# Patient Record
Sex: Female | Born: 1960 | Race: White | Hispanic: No | Smoking: Never smoker
Health system: Southern US, Community
[De-identification: ages and names within clinical notes are randomized; demographics above are authoritative.]

## PROBLEM LIST (undated history)

## (undated) DIAGNOSIS — G43909 Migraine, unspecified, not intractable, without status migrainosus: Secondary | ICD-10-CM

## (undated) DIAGNOSIS — T8859XA Other complications of anesthesia, initial encounter: Secondary | ICD-10-CM

## (undated) DIAGNOSIS — L309 Dermatitis, unspecified: Secondary | ICD-10-CM

## (undated) DIAGNOSIS — B009 Herpesviral infection, unspecified: Secondary | ICD-10-CM

## (undated) DIAGNOSIS — J302 Other seasonal allergic rhinitis: Secondary | ICD-10-CM

## (undated) DIAGNOSIS — D649 Anemia, unspecified: Secondary | ICD-10-CM

## (undated) HISTORY — DX: Migraine, unspecified, not intractable, without status migrainosus: G43.909

## (undated) HISTORY — PX: TONSILLECTOMY: SUR1361

## (undated) HISTORY — DX: Other seasonal allergic rhinitis: J30.2

## (undated) HISTORY — DX: Dermatitis, unspecified: L30.9

---

## 1999-05-20 ENCOUNTER — Encounter: Admission: RE | Admit: 1999-05-20 | Discharge: 1999-05-20 | Payer: Self-pay | Admitting: Family Medicine

## 1999-05-20 ENCOUNTER — Encounter: Payer: Self-pay | Admitting: Family Medicine

## 1999-11-12 ENCOUNTER — Encounter: Admission: RE | Admit: 1999-11-12 | Discharge: 1999-11-12 | Payer: Self-pay | Admitting: Gynecology

## 1999-11-12 ENCOUNTER — Encounter: Payer: Self-pay | Admitting: Gynecology

## 2000-05-23 ENCOUNTER — Other Ambulatory Visit: Admission: RE | Admit: 2000-05-23 | Discharge: 2000-05-23 | Payer: Self-pay | Admitting: Gynecology

## 2001-02-23 ENCOUNTER — Encounter: Admission: RE | Admit: 2001-02-23 | Discharge: 2001-02-23 | Payer: Self-pay | Admitting: Gynecology

## 2001-02-23 ENCOUNTER — Encounter: Payer: Self-pay | Admitting: Gynecology

## 2001-10-02 ENCOUNTER — Other Ambulatory Visit: Admission: RE | Admit: 2001-10-02 | Discharge: 2001-10-02 | Payer: Self-pay | Admitting: Gynecology

## 2003-04-14 ENCOUNTER — Other Ambulatory Visit: Admission: RE | Admit: 2003-04-14 | Discharge: 2003-04-14 | Payer: Self-pay | Admitting: Gynecology

## 2003-09-25 ENCOUNTER — Other Ambulatory Visit: Admission: RE | Admit: 2003-09-25 | Discharge: 2003-09-25 | Payer: Self-pay | Admitting: Gynecology

## 2004-06-17 ENCOUNTER — Other Ambulatory Visit: Admission: RE | Admit: 2004-06-17 | Discharge: 2004-06-17 | Payer: Self-pay | Admitting: Gynecology

## 2004-07-09 ENCOUNTER — Encounter: Admission: RE | Admit: 2004-07-09 | Discharge: 2004-07-09 | Payer: Self-pay | Admitting: Gynecology

## 2005-09-14 ENCOUNTER — Other Ambulatory Visit: Admission: RE | Admit: 2005-09-14 | Discharge: 2005-09-14 | Payer: Self-pay | Admitting: Gynecology

## 2005-09-21 ENCOUNTER — Encounter: Admission: RE | Admit: 2005-09-21 | Discharge: 2005-09-21 | Payer: Self-pay | Admitting: Gynecology

## 2006-09-20 ENCOUNTER — Other Ambulatory Visit: Admission: RE | Admit: 2006-09-20 | Discharge: 2006-09-20 | Payer: Self-pay | Admitting: Gynecology

## 2007-09-24 ENCOUNTER — Encounter: Admission: RE | Admit: 2007-09-24 | Discharge: 2007-09-24 | Payer: Self-pay | Admitting: Gynecology

## 2009-07-15 ENCOUNTER — Encounter: Admission: RE | Admit: 2009-07-15 | Discharge: 2009-07-15 | Payer: Self-pay | Admitting: Diagnostic Radiology

## 2009-07-28 ENCOUNTER — Encounter: Admission: RE | Admit: 2009-07-28 | Discharge: 2009-07-28 | Payer: Self-pay | Admitting: Family Medicine

## 2010-06-05 ENCOUNTER — Encounter: Payer: Self-pay | Admitting: Gynecology

## 2010-06-06 ENCOUNTER — Encounter: Payer: Self-pay | Admitting: Family Medicine

## 2010-08-12 ENCOUNTER — Emergency Department (HOSPITAL_COMMUNITY): Payer: BC Managed Care – PPO

## 2010-08-12 ENCOUNTER — Encounter (HOSPITAL_COMMUNITY): Payer: Self-pay | Admitting: Radiology

## 2010-08-12 ENCOUNTER — Emergency Department (HOSPITAL_COMMUNITY)
Admission: EM | Admit: 2010-08-12 | Discharge: 2010-08-12 | Disposition: A | Discharge status: Home / Self Care | Payer: BC Managed Care – PPO | Attending: Emergency Medicine | Admitting: Emergency Medicine

## 2010-08-12 DIAGNOSIS — R071 Chest pain on breathing: Secondary | ICD-10-CM | POA: Insufficient documentation

## 2010-08-12 DIAGNOSIS — Z79899 Other long term (current) drug therapy: Secondary | ICD-10-CM | POA: Insufficient documentation

## 2010-08-12 DIAGNOSIS — Z7982 Long term (current) use of aspirin: Secondary | ICD-10-CM | POA: Insufficient documentation

## 2010-08-12 HISTORY — DX: Herpesviral infection, unspecified: B00.9

## 2010-08-12 LAB — CBC
HCT: 33.6 % — ABNORMAL LOW (ref 36.0–46.0)
MCH: 25.8 pg — ABNORMAL LOW (ref 26.0–34.0)
MCV: 80.2 fL (ref 78.0–100.0)
Platelets: 225 10*3/uL (ref 150–400)
WBC: 9.8 10*3/uL (ref 4.0–10.5)

## 2010-08-12 LAB — POCT I-STAT, CHEM 8
Calcium, Ion: 1.15 mmol/L (ref 1.12–1.32)
HCT: 34 % — ABNORMAL LOW (ref 36.0–46.0)
Hemoglobin: 11.6 g/dL — ABNORMAL LOW (ref 12.0–15.0)
Sodium: 139 mEq/L (ref 135–145)
TCO2: 26 mmol/L (ref 0–100)

## 2010-08-12 LAB — DIFFERENTIAL
Basophils Absolute: 0 10*3/uL (ref 0.0–0.1)
Eosinophils Relative: 1 % (ref 0–5)
Neutro Abs: 6.3 10*3/uL (ref 1.7–7.7)
Neutrophils Relative %: 64 % (ref 43–77)

## 2010-08-12 MED ORDER — IOHEXOL 350 MG/ML SOLN
100.0000 mL | Freq: Once | INTRAVENOUS | Status: AC | PRN
  Administered 2010-08-12: 100 mL via INTRAVENOUS

## 2010-08-13 ENCOUNTER — Other Ambulatory Visit (HOSPITAL_COMMUNITY): Payer: Self-pay | Admitting: Family Medicine

## 2010-08-13 DIAGNOSIS — S24151A Other incomplete lesion at T1 level of thoracic spinal cord, initial encounter: Secondary | ICD-10-CM

## 2010-08-23 ENCOUNTER — Encounter (HOSPITAL_COMMUNITY): Payer: BC Managed Care – PPO

## 2010-08-23 ENCOUNTER — Ambulatory Visit (HOSPITAL_COMMUNITY): Payer: BC Managed Care – PPO

## 2010-08-31 ENCOUNTER — Encounter (HOSPITAL_COMMUNITY)
Admission: RE | Admit: 2010-08-31 | Discharge: 2010-08-31 | Disposition: A | Discharge status: Home / Self Care | Payer: BC Managed Care – PPO | Source: Ambulatory Visit | Attending: Family Medicine | Admitting: Family Medicine

## 2010-08-31 ENCOUNTER — Ambulatory Visit (HOSPITAL_COMMUNITY): Payer: BC Managed Care – PPO

## 2010-08-31 DIAGNOSIS — M899 Disorder of bone, unspecified: Secondary | ICD-10-CM | POA: Insufficient documentation

## 2010-08-31 DIAGNOSIS — S24151A Other incomplete lesion at T1 level of thoracic spinal cord, initial encounter: Secondary | ICD-10-CM

## 2010-08-31 DIAGNOSIS — M949 Disorder of cartilage, unspecified: Secondary | ICD-10-CM | POA: Insufficient documentation

## 2010-08-31 MED ORDER — TECHNETIUM TC 99M MEDRONATE IV KIT: 24.0000 | PACK | Freq: Once | INTRAVENOUS | Status: AC | PRN

## 2010-10-01 ENCOUNTER — Ambulatory Visit
Admission: RE | Admit: 2010-10-01 | Discharge: 2010-10-01 | Disposition: A | Discharge status: Home / Self Care | Payer: BC Managed Care – PPO | Source: Ambulatory Visit | Attending: Family Medicine | Admitting: Family Medicine

## 2010-10-01 ENCOUNTER — Other Ambulatory Visit: Payer: Self-pay | Admitting: Family Medicine

## 2010-10-01 DIAGNOSIS — Z1231 Encounter for screening mammogram for malignant neoplasm of breast: Secondary | ICD-10-CM

## 2012-04-04 ENCOUNTER — Other Ambulatory Visit: Payer: Self-pay | Admitting: Family Medicine

## 2012-04-04 DIAGNOSIS — Z1231 Encounter for screening mammogram for malignant neoplasm of breast: Secondary | ICD-10-CM

## 2012-04-05 ENCOUNTER — Ambulatory Visit
Admission: RE | Admit: 2012-04-05 | Discharge: 2012-04-05 | Disposition: A | Discharge status: Home / Self Care | Payer: BC Managed Care – PPO | Source: Ambulatory Visit | Attending: Family Medicine | Admitting: Family Medicine

## 2012-04-05 DIAGNOSIS — Z1231 Encounter for screening mammogram for malignant neoplasm of breast: Secondary | ICD-10-CM

## 2013-11-06 ENCOUNTER — Other Ambulatory Visit: Payer: Self-pay | Admitting: Family Medicine

## 2013-11-06 ENCOUNTER — Ambulatory Visit
Admission: RE | Admit: 2013-11-06 | Discharge: 2013-11-06 | Disposition: A | Discharge status: Home / Self Care | Payer: BC Managed Care – PPO | Source: Ambulatory Visit | Attending: Family Medicine | Admitting: Family Medicine

## 2013-11-06 DIAGNOSIS — M25551 Pain in right hip: Secondary | ICD-10-CM

## 2013-11-06 DIAGNOSIS — M546 Pain in thoracic spine: Secondary | ICD-10-CM

## 2015-01-12 ENCOUNTER — Other Ambulatory Visit: Payer: Self-pay | Admitting: Family Medicine

## 2015-01-12 ENCOUNTER — Ambulatory Visit
Admission: RE | Admit: 2015-01-12 | Discharge: 2015-01-12 | Disposition: A | Discharge status: Home / Self Care | Payer: BLUE CROSS/BLUE SHIELD | Source: Ambulatory Visit | Attending: Family Medicine | Admitting: Family Medicine

## 2015-01-12 DIAGNOSIS — M79605 Pain in left leg: Secondary | ICD-10-CM

## 2015-08-18 DIAGNOSIS — Z6828 Body mass index (BMI) 28.0-28.9, adult: Secondary | ICD-10-CM | POA: Diagnosis not present

## 2015-08-18 DIAGNOSIS — Z780 Asymptomatic menopausal state: Secondary | ICD-10-CM | POA: Diagnosis not present

## 2015-08-18 DIAGNOSIS — Z1389 Encounter for screening for other disorder: Secondary | ICD-10-CM | POA: Diagnosis not present

## 2015-08-18 DIAGNOSIS — Z01419 Encounter for gynecological examination (general) (routine) without abnormal findings: Secondary | ICD-10-CM | POA: Diagnosis not present

## 2015-08-18 DIAGNOSIS — N952 Postmenopausal atrophic vaginitis: Secondary | ICD-10-CM | POA: Diagnosis not present

## 2015-08-18 DIAGNOSIS — Z1231 Encounter for screening mammogram for malignant neoplasm of breast: Secondary | ICD-10-CM | POA: Diagnosis not present

## 2015-08-26 DIAGNOSIS — L821 Other seborrheic keratosis: Secondary | ICD-10-CM | POA: Diagnosis not present

## 2015-08-26 DIAGNOSIS — L738 Other specified follicular disorders: Secondary | ICD-10-CM | POA: Diagnosis not present

## 2015-08-26 DIAGNOSIS — L603 Nail dystrophy: Secondary | ICD-10-CM | POA: Diagnosis not present

## 2015-08-26 DIAGNOSIS — L820 Inflamed seborrheic keratosis: Secondary | ICD-10-CM | POA: Diagnosis not present

## 2016-01-28 DIAGNOSIS — T63441A Toxic effect of venom of bees, accidental (unintentional), initial encounter: Secondary | ICD-10-CM | POA: Diagnosis not present

## 2016-01-28 DIAGNOSIS — G500 Trigeminal neuralgia: Secondary | ICD-10-CM | POA: Diagnosis not present

## 2016-02-05 DIAGNOSIS — D2261 Melanocytic nevi of right upper limb, including shoulder: Secondary | ICD-10-CM | POA: Diagnosis not present

## 2016-02-05 DIAGNOSIS — L738 Other specified follicular disorders: Secondary | ICD-10-CM | POA: Diagnosis not present

## 2016-02-05 DIAGNOSIS — L821 Other seborrheic keratosis: Secondary | ICD-10-CM | POA: Diagnosis not present

## 2016-02-05 DIAGNOSIS — L720 Epidermal cyst: Secondary | ICD-10-CM | POA: Diagnosis not present

## 2016-02-12 DIAGNOSIS — H5203 Hypermetropia, bilateral: Secondary | ICD-10-CM | POA: Diagnosis not present

## 2016-02-12 DIAGNOSIS — H18832 Recurrent erosion of cornea, left eye: Secondary | ICD-10-CM | POA: Diagnosis not present

## 2016-03-29 DIAGNOSIS — Z0000 Encounter for general adult medical examination without abnormal findings: Secondary | ICD-10-CM | POA: Diagnosis not present

## 2016-03-29 DIAGNOSIS — Z1159 Encounter for screening for other viral diseases: Secondary | ICD-10-CM | POA: Diagnosis not present

## 2016-03-29 DIAGNOSIS — Z131 Encounter for screening for diabetes mellitus: Secondary | ICD-10-CM | POA: Diagnosis not present

## 2016-03-29 DIAGNOSIS — Z13220 Encounter for screening for lipoid disorders: Secondary | ICD-10-CM | POA: Diagnosis not present

## 2016-03-29 DIAGNOSIS — Z23 Encounter for immunization: Secondary | ICD-10-CM | POA: Diagnosis not present

## 2016-06-13 DIAGNOSIS — M8588 Other specified disorders of bone density and structure, other site: Secondary | ICD-10-CM | POA: Diagnosis not present

## 2016-07-04 DIAGNOSIS — H6121 Impacted cerumen, right ear: Secondary | ICD-10-CM | POA: Diagnosis not present

## 2016-07-04 DIAGNOSIS — F17210 Nicotine dependence, cigarettes, uncomplicated: Secondary | ICD-10-CM | POA: Diagnosis not present

## 2016-07-04 DIAGNOSIS — H93291 Other abnormal auditory perceptions, right ear: Secondary | ICD-10-CM | POA: Diagnosis not present

## 2016-08-24 DIAGNOSIS — N898 Other specified noninflammatory disorders of vagina: Secondary | ICD-10-CM | POA: Diagnosis not present

## 2016-08-24 DIAGNOSIS — N952 Postmenopausal atrophic vaginitis: Secondary | ICD-10-CM | POA: Diagnosis not present

## 2016-10-20 DIAGNOSIS — Z1389 Encounter for screening for other disorder: Secondary | ICD-10-CM | POA: Diagnosis not present

## 2016-10-20 DIAGNOSIS — Z1151 Encounter for screening for human papillomavirus (HPV): Secondary | ICD-10-CM | POA: Diagnosis not present

## 2016-10-20 DIAGNOSIS — N952 Postmenopausal atrophic vaginitis: Secondary | ICD-10-CM | POA: Diagnosis not present

## 2016-10-20 DIAGNOSIS — Z01419 Encounter for gynecological examination (general) (routine) without abnormal findings: Secondary | ICD-10-CM | POA: Diagnosis not present

## 2016-10-20 DIAGNOSIS — Z1231 Encounter for screening mammogram for malignant neoplasm of breast: Secondary | ICD-10-CM | POA: Diagnosis not present

## 2016-10-20 DIAGNOSIS — Z6829 Body mass index (BMI) 29.0-29.9, adult: Secondary | ICD-10-CM | POA: Diagnosis not present

## 2016-10-20 DIAGNOSIS — N898 Other specified noninflammatory disorders of vagina: Secondary | ICD-10-CM | POA: Diagnosis not present

## 2016-10-20 DIAGNOSIS — Z124 Encounter for screening for malignant neoplasm of cervix: Secondary | ICD-10-CM | POA: Diagnosis not present

## 2016-10-20 DIAGNOSIS — Z130 Encounter for screening for diseases of the blood and blood-forming organs and certain disorders involving the immune mechanism: Secondary | ICD-10-CM | POA: Diagnosis not present

## 2017-01-23 DIAGNOSIS — D2261 Melanocytic nevi of right upper limb, including shoulder: Secondary | ICD-10-CM | POA: Diagnosis not present

## 2017-01-23 DIAGNOSIS — L738 Other specified follicular disorders: Secondary | ICD-10-CM | POA: Diagnosis not present

## 2017-01-23 DIAGNOSIS — D225 Melanocytic nevi of trunk: Secondary | ICD-10-CM | POA: Diagnosis not present

## 2017-01-23 DIAGNOSIS — D2262 Melanocytic nevi of left upper limb, including shoulder: Secondary | ICD-10-CM | POA: Diagnosis not present

## 2017-01-26 DIAGNOSIS — N898 Other specified noninflammatory disorders of vagina: Secondary | ICD-10-CM | POA: Diagnosis not present

## 2017-01-26 DIAGNOSIS — N952 Postmenopausal atrophic vaginitis: Secondary | ICD-10-CM | POA: Diagnosis not present

## 2017-02-16 DIAGNOSIS — H5203 Hypermetropia, bilateral: Secondary | ICD-10-CM | POA: Diagnosis not present

## 2017-02-16 DIAGNOSIS — H18832 Recurrent erosion of cornea, left eye: Secondary | ICD-10-CM | POA: Diagnosis not present

## 2017-03-29 DIAGNOSIS — F324 Major depressive disorder, single episode, in partial remission: Secondary | ICD-10-CM | POA: Diagnosis not present

## 2017-03-29 DIAGNOSIS — E785 Hyperlipidemia, unspecified: Secondary | ICD-10-CM | POA: Diagnosis not present

## 2017-03-29 DIAGNOSIS — Z0000 Encounter for general adult medical examination without abnormal findings: Secondary | ICD-10-CM | POA: Diagnosis not present
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
## 2017-09-13 DIAGNOSIS — M9903 Segmental and somatic dysfunction of lumbar region: Secondary | ICD-10-CM | POA: Diagnosis not present

## 2017-09-13 DIAGNOSIS — Q72812 Congenital shortening of left lower limb: Secondary | ICD-10-CM | POA: Diagnosis not present

## 2017-09-13 DIAGNOSIS — M5442 Lumbago with sciatica, left side: Secondary | ICD-10-CM | POA: Diagnosis not present

## 2017-09-13 DIAGNOSIS — M9905 Segmental and somatic dysfunction of pelvic region: Secondary | ICD-10-CM | POA: Diagnosis not present

## 2017-09-14 DIAGNOSIS — M9903 Segmental and somatic dysfunction of lumbar region: Secondary | ICD-10-CM | POA: Diagnosis not present

## 2017-09-14 DIAGNOSIS — M5442 Lumbago with sciatica, left side: Secondary | ICD-10-CM | POA: Diagnosis not present

## 2017-09-14 DIAGNOSIS — M9905 Segmental and somatic dysfunction of pelvic region: Secondary | ICD-10-CM | POA: Diagnosis not present

## 2017-09-14 DIAGNOSIS — Q72812 Congenital shortening of left lower limb: Secondary | ICD-10-CM | POA: Diagnosis not present

## 2017-09-15 DIAGNOSIS — M5442 Lumbago with sciatica, left side: Secondary | ICD-10-CM | POA: Diagnosis not present

## 2017-09-15 DIAGNOSIS — Q72812 Congenital shortening of left lower limb: Secondary | ICD-10-CM | POA: Diagnosis not present

## 2017-09-15 DIAGNOSIS — M9903 Segmental and somatic dysfunction of lumbar region: Secondary | ICD-10-CM | POA: Diagnosis not present

## 2017-09-15 DIAGNOSIS — M9905 Segmental and somatic dysfunction of pelvic region: Secondary | ICD-10-CM | POA: Diagnosis not present

## 2017-09-18 DIAGNOSIS — M9903 Segmental and somatic dysfunction of lumbar region: Secondary | ICD-10-CM | POA: Diagnosis not present

## 2017-09-18 DIAGNOSIS — M5442 Lumbago with sciatica, left side: Secondary | ICD-10-CM | POA: Diagnosis not present

## 2017-09-18 DIAGNOSIS — M9905 Segmental and somatic dysfunction of pelvic region: Secondary | ICD-10-CM | POA: Diagnosis not present

## 2017-09-18 DIAGNOSIS — Q72812 Congenital shortening of left lower limb: Secondary | ICD-10-CM | POA: Diagnosis not present

## 2017-09-19 DIAGNOSIS — Q72812 Congenital shortening of left lower limb: Secondary | ICD-10-CM | POA: Diagnosis not present

## 2017-09-19 DIAGNOSIS — M5442 Lumbago with sciatica, left side: Secondary | ICD-10-CM | POA: Diagnosis not present

## 2017-09-19 DIAGNOSIS — M9903 Segmental and somatic dysfunction of lumbar region: Secondary | ICD-10-CM | POA: Diagnosis not present

## 2017-09-19 DIAGNOSIS — M9905 Segmental and somatic dysfunction of pelvic region: Secondary | ICD-10-CM | POA: Diagnosis not present

## 2017-09-21 DIAGNOSIS — M9905 Segmental and somatic dysfunction of pelvic region: Secondary | ICD-10-CM | POA: Diagnosis not present

## 2017-09-21 DIAGNOSIS — Q72812 Congenital shortening of left lower limb: Secondary | ICD-10-CM | POA: Diagnosis not present

## 2017-09-21 DIAGNOSIS — M9903 Segmental and somatic dysfunction of lumbar region: Secondary | ICD-10-CM | POA: Diagnosis not present

## 2017-09-21 DIAGNOSIS — M5442 Lumbago with sciatica, left side: Secondary | ICD-10-CM | POA: Diagnosis not present

## 2017-09-25 DIAGNOSIS — M9903 Segmental and somatic dysfunction of lumbar region: Secondary | ICD-10-CM | POA: Diagnosis not present

## 2017-09-25 DIAGNOSIS — M5442 Lumbago with sciatica, left side: Secondary | ICD-10-CM | POA: Diagnosis not present

## 2017-09-25 DIAGNOSIS — M9905 Segmental and somatic dysfunction of pelvic region: Secondary | ICD-10-CM | POA: Diagnosis not present

## 2017-09-25 DIAGNOSIS — Q72812 Congenital shortening of left lower limb: Secondary | ICD-10-CM | POA: Diagnosis not present

## 2017-09-26 DIAGNOSIS — Q72812 Congenital shortening of left lower limb: Secondary | ICD-10-CM | POA: Diagnosis not present

## 2017-09-26 DIAGNOSIS — M5442 Lumbago with sciatica, left side: Secondary | ICD-10-CM | POA: Diagnosis not present

## 2017-09-26 DIAGNOSIS — M9903 Segmental and somatic dysfunction of lumbar region: Secondary | ICD-10-CM | POA: Diagnosis not present

## 2017-09-26 DIAGNOSIS — M9905 Segmental and somatic dysfunction of pelvic region: Secondary | ICD-10-CM | POA: Diagnosis not present

## 2017-09-28 DIAGNOSIS — Q72812 Congenital shortening of left lower limb: Secondary | ICD-10-CM | POA: Diagnosis not present

## 2017-09-28 DIAGNOSIS — M5442 Lumbago with sciatica, left side: Secondary | ICD-10-CM | POA: Diagnosis not present

## 2017-09-28 DIAGNOSIS — M9905 Segmental and somatic dysfunction of pelvic region: Secondary | ICD-10-CM | POA: Diagnosis not present

## 2017-09-28 DIAGNOSIS — M9903 Segmental and somatic dysfunction of lumbar region: Secondary | ICD-10-CM | POA: Diagnosis not present

## 2017-10-02 DIAGNOSIS — M5442 Lumbago with sciatica, left side: Secondary | ICD-10-CM | POA: Diagnosis not present

## 2017-10-02 DIAGNOSIS — Q72812 Congenital shortening of left lower limb: Secondary | ICD-10-CM | POA: Diagnosis not present

## 2017-10-02 DIAGNOSIS — M9903 Segmental and somatic dysfunction of lumbar region: Secondary | ICD-10-CM | POA: Diagnosis not present

## 2017-10-02 DIAGNOSIS — M9905 Segmental and somatic dysfunction of pelvic region: Secondary | ICD-10-CM | POA: Diagnosis not present

## 2017-10-03 DIAGNOSIS — M5442 Lumbago with sciatica, left side: Secondary | ICD-10-CM | POA: Diagnosis not present

## 2017-10-03 DIAGNOSIS — M9905 Segmental and somatic dysfunction of pelvic region: Secondary | ICD-10-CM | POA: Diagnosis not present

## 2017-10-03 DIAGNOSIS — Q72812 Congenital shortening of left lower limb: Secondary | ICD-10-CM | POA: Diagnosis not present

## 2017-10-03 DIAGNOSIS — M9903 Segmental and somatic dysfunction of lumbar region: Secondary | ICD-10-CM | POA: Diagnosis not present

## 2017-10-04 DIAGNOSIS — Q72812 Congenital shortening of left lower limb: Secondary | ICD-10-CM | POA: Diagnosis not present

## 2017-10-04 DIAGNOSIS — M9905 Segmental and somatic dysfunction of pelvic region: Secondary | ICD-10-CM | POA: Diagnosis not present

## 2017-10-04 DIAGNOSIS — M9903 Segmental and somatic dysfunction of lumbar region: Secondary | ICD-10-CM | POA: Diagnosis not present

## 2017-10-04 DIAGNOSIS — M5442 Lumbago with sciatica, left side: Secondary | ICD-10-CM | POA: Diagnosis not present

## 2017-10-05 DIAGNOSIS — Q72812 Congenital shortening of left lower limb: Secondary | ICD-10-CM | POA: Diagnosis not present

## 2017-10-05 DIAGNOSIS — M9905 Segmental and somatic dysfunction of pelvic region: Secondary | ICD-10-CM | POA: Diagnosis not present

## 2017-10-05 DIAGNOSIS — M9903 Segmental and somatic dysfunction of lumbar region: Secondary | ICD-10-CM | POA: Diagnosis not present

## 2017-10-05 DIAGNOSIS — M5442 Lumbago with sciatica, left side: Secondary | ICD-10-CM | POA: Diagnosis not present

## 2017-10-10 DIAGNOSIS — M5442 Lumbago with sciatica, left side: Secondary | ICD-10-CM | POA: Diagnosis not present

## 2017-10-10 DIAGNOSIS — M9903 Segmental and somatic dysfunction of lumbar region: Secondary | ICD-10-CM | POA: Diagnosis not present

## 2017-10-10 DIAGNOSIS — Q72812 Congenital shortening of left lower limb: Secondary | ICD-10-CM | POA: Diagnosis not present

## 2017-10-10 DIAGNOSIS — M9905 Segmental and somatic dysfunction of pelvic region: Secondary | ICD-10-CM | POA: Diagnosis not present

## 2017-10-12 DIAGNOSIS — M9905 Segmental and somatic dysfunction of pelvic region: Secondary | ICD-10-CM | POA: Diagnosis not present

## 2017-10-12 DIAGNOSIS — M5442 Lumbago with sciatica, left side: Secondary | ICD-10-CM | POA: Diagnosis not present

## 2017-10-12 DIAGNOSIS — M9903 Segmental and somatic dysfunction of lumbar region: Secondary | ICD-10-CM | POA: Diagnosis not present

## 2017-10-12 DIAGNOSIS — Q72812 Congenital shortening of left lower limb: Secondary | ICD-10-CM | POA: Diagnosis not present

## 2017-10-16 DIAGNOSIS — M5442 Lumbago with sciatica, left side: Secondary | ICD-10-CM | POA: Diagnosis not present

## 2017-10-16 DIAGNOSIS — Q72812 Congenital shortening of left lower limb: Secondary | ICD-10-CM | POA: Diagnosis not present

## 2017-10-16 DIAGNOSIS — M9903 Segmental and somatic dysfunction of lumbar region: Secondary | ICD-10-CM | POA: Diagnosis not present

## 2017-10-16 DIAGNOSIS — M9905 Segmental and somatic dysfunction of pelvic region: Secondary | ICD-10-CM | POA: Diagnosis not present

## 2017-10-18 DIAGNOSIS — M5442 Lumbago with sciatica, left side: Secondary | ICD-10-CM | POA: Diagnosis not present

## 2017-10-18 DIAGNOSIS — Q72812 Congenital shortening of left lower limb: Secondary | ICD-10-CM | POA: Diagnosis not present

## 2017-10-18 DIAGNOSIS — M9903 Segmental and somatic dysfunction of lumbar region: Secondary | ICD-10-CM | POA: Diagnosis not present

## 2017-10-18 DIAGNOSIS — M9905 Segmental and somatic dysfunction of pelvic region: Secondary | ICD-10-CM | POA: Diagnosis not present

## 2017-10-23 DIAGNOSIS — Q72812 Congenital shortening of left lower limb: Secondary | ICD-10-CM | POA: Diagnosis not present

## 2017-10-23 DIAGNOSIS — M9903 Segmental and somatic dysfunction of lumbar region: Secondary | ICD-10-CM | POA: Diagnosis not present

## 2017-10-23 DIAGNOSIS — M9905 Segmental and somatic dysfunction of pelvic region: Secondary | ICD-10-CM | POA: Diagnosis not present

## 2017-10-23 DIAGNOSIS — M5442 Lumbago with sciatica, left side: Secondary | ICD-10-CM | POA: Diagnosis not present

## 2017-10-26 DIAGNOSIS — M5442 Lumbago with sciatica, left side: Secondary | ICD-10-CM | POA: Diagnosis not present

## 2017-10-26 DIAGNOSIS — Q72812 Congenital shortening of left lower limb: Secondary | ICD-10-CM | POA: Diagnosis not present

## 2017-10-26 DIAGNOSIS — M9903 Segmental and somatic dysfunction of lumbar region: Secondary | ICD-10-CM | POA: Diagnosis not present

## 2017-10-26 DIAGNOSIS — M9905 Segmental and somatic dysfunction of pelvic region: Secondary | ICD-10-CM | POA: Diagnosis not present

## 2017-10-30 DIAGNOSIS — M5442 Lumbago with sciatica, left side: Secondary | ICD-10-CM | POA: Diagnosis not present

## 2017-10-30 DIAGNOSIS — M9903 Segmental and somatic dysfunction of lumbar region: Secondary | ICD-10-CM | POA: Diagnosis not present

## 2017-10-30 DIAGNOSIS — R002 Palpitations: Secondary | ICD-10-CM | POA: Diagnosis not present

## 2017-10-30 DIAGNOSIS — B351 Tinea unguium: Secondary | ICD-10-CM | POA: Diagnosis not present

## 2017-10-30 DIAGNOSIS — M9905 Segmental and somatic dysfunction of pelvic region: Secondary | ICD-10-CM | POA: Diagnosis not present

## 2017-10-30 DIAGNOSIS — Q72812 Congenital shortening of left lower limb: Secondary | ICD-10-CM | POA: Diagnosis not present

## 2017-11-07 DIAGNOSIS — Z1231 Encounter for screening mammogram for malignant neoplasm of breast: Secondary | ICD-10-CM | POA: Diagnosis not present

## 2017-11-07 DIAGNOSIS — Z780 Asymptomatic menopausal state: Secondary | ICD-10-CM | POA: Diagnosis not present

## 2017-11-07 DIAGNOSIS — Z6828 Body mass index (BMI) 28.0-28.9, adult: Secondary | ICD-10-CM | POA: Diagnosis not present

## 2017-11-07 DIAGNOSIS — Z130 Encounter for screening for diseases of the blood and blood-forming organs and certain disorders involving the immune mechanism: Secondary | ICD-10-CM | POA: Diagnosis not present

## 2017-11-07 DIAGNOSIS — Z1389 Encounter for screening for other disorder: Secondary | ICD-10-CM | POA: Diagnosis not present

## 2017-11-07 DIAGNOSIS — Z01419 Encounter for gynecological examination (general) (routine) without abnormal findings: Secondary | ICD-10-CM | POA: Diagnosis not present

## 2018-02-22 DIAGNOSIS — H52203 Unspecified astigmatism, bilateral: Secondary | ICD-10-CM | POA: Diagnosis not present

## 2018-02-22 DIAGNOSIS — H524 Presbyopia: Secondary | ICD-10-CM | POA: Diagnosis not present

## 2018-02-22 DIAGNOSIS — H18832 Recurrent erosion of cornea, left eye: Secondary | ICD-10-CM | POA: Diagnosis not present

## 2018-02-26 DIAGNOSIS — D2262 Melanocytic nevi of left upper limb, including shoulder: Secondary | ICD-10-CM | POA: Diagnosis not present

## 2018-02-26 DIAGNOSIS — L738 Other specified follicular disorders: Secondary | ICD-10-CM | POA: Diagnosis not present

## 2018-02-26 DIAGNOSIS — L820 Inflamed seborrheic keratosis: Secondary | ICD-10-CM | POA: Diagnosis not present

## 2018-02-26 DIAGNOSIS — L821 Other seborrheic keratosis: Secondary | ICD-10-CM | POA: Diagnosis not present

## 2018-02-26 DIAGNOSIS — D1801 Hemangioma of skin and subcutaneous tissue: Secondary | ICD-10-CM | POA: Diagnosis not present

## 2018-04-10 DIAGNOSIS — Z0000 Encounter for general adult medical examination without abnormal findings: Secondary | ICD-10-CM | POA: Diagnosis not present

## 2018-04-10 DIAGNOSIS — F324 Major depressive disorder, single episode, in partial remission: Secondary | ICD-10-CM | POA: Diagnosis not present

## 2018-04-10 DIAGNOSIS — J309 Allergic rhinitis, unspecified: Secondary | ICD-10-CM | POA: Diagnosis not present

## 2018-04-10 DIAGNOSIS — E785 Hyperlipidemia, unspecified: Secondary | ICD-10-CM | POA: Diagnosis not present

## 2018-06-26 DIAGNOSIS — F4323 Adjustment disorder with mixed anxiety and depressed mood: Secondary | ICD-10-CM | POA: Diagnosis not present

## 2018-07-09 DIAGNOSIS — F4323 Adjustment disorder with mixed anxiety and depressed mood: Secondary | ICD-10-CM | POA: Diagnosis not present

## 2018-07-30 DIAGNOSIS — F4323 Adjustment disorder with mixed anxiety and depressed mood: Secondary | ICD-10-CM | POA: Diagnosis not present

## 2018-08-06 DIAGNOSIS — F4323 Adjustment disorder with mixed anxiety and depressed mood: Secondary | ICD-10-CM | POA: Diagnosis not present

## 2018-08-20 DIAGNOSIS — F4323 Adjustment disorder with mixed anxiety and depressed mood: Secondary | ICD-10-CM | POA: Diagnosis not present

## 2018-11-27 DIAGNOSIS — Z780 Asymptomatic menopausal state: Secondary | ICD-10-CM | POA: Diagnosis not present

## 2018-11-27 DIAGNOSIS — Z130 Encounter for screening for diseases of the blood and blood-forming organs and certain disorders involving the immune mechanism: Secondary | ICD-10-CM | POA: Diagnosis not present

## 2018-11-27 DIAGNOSIS — Z1231 Encounter for screening mammogram for malignant neoplasm of breast: Secondary | ICD-10-CM | POA: Diagnosis not present

## 2018-11-27 DIAGNOSIS — Z01419 Encounter for gynecological examination (general) (routine) without abnormal findings: Secondary | ICD-10-CM | POA: Diagnosis not present

## 2018-11-27 DIAGNOSIS — Z6827 Body mass index (BMI) 27.0-27.9, adult: Secondary | ICD-10-CM | POA: Diagnosis not present

## 2019-02-28 DIAGNOSIS — H2513 Age-related nuclear cataract, bilateral: Secondary | ICD-10-CM | POA: Diagnosis not present

## 2019-02-28 DIAGNOSIS — H18832 Recurrent erosion of cornea, left eye: Secondary | ICD-10-CM | POA: Diagnosis not present

## 2019-02-28 DIAGNOSIS — H5203 Hypermetropia, bilateral: Secondary | ICD-10-CM | POA: Diagnosis not present

## 2019-03-22 ENCOUNTER — Other Ambulatory Visit: Payer: Self-pay

## 2019-03-22 DIAGNOSIS — Z20822 Contact with and (suspected) exposure to covid-19: Secondary | ICD-10-CM

## 2019-03-23 LAB — NOVEL CORONAVIRUS, NAA: SARS-CoV-2, NAA: NOT DETECTED

## 2019-04-29 DIAGNOSIS — Z0000 Encounter for general adult medical examination without abnormal findings: Secondary | ICD-10-CM | POA: Diagnosis not present

## 2019-04-29 DIAGNOSIS — E785 Hyperlipidemia, unspecified: Secondary | ICD-10-CM | POA: Diagnosis not present

## 2019-06-05 DIAGNOSIS — L821 Other seborrheic keratosis: Secondary | ICD-10-CM | POA: Diagnosis not present

## 2019-06-05 DIAGNOSIS — D2271 Melanocytic nevi of right lower limb, including hip: Secondary | ICD-10-CM | POA: Diagnosis not present

## 2019-06-05 DIAGNOSIS — L738 Other specified follicular disorders: Secondary | ICD-10-CM | POA: Diagnosis not present

## 2019-06-05 DIAGNOSIS — D2272 Melanocytic nevi of left lower limb, including hip: Secondary | ICD-10-CM | POA: Diagnosis not present

## 2019-08-03 DIAGNOSIS — S76912A Strain of unspecified muscles, fascia and tendons at thigh level, left thigh, initial encounter: Secondary | ICD-10-CM | POA: Diagnosis not present

## 2019-08-03 DIAGNOSIS — M79605 Pain in left leg: Secondary | ICD-10-CM | POA: Diagnosis not present

## 2019-08-06 DIAGNOSIS — E7800 Pure hypercholesterolemia, unspecified: Secondary | ICD-10-CM | POA: Diagnosis not present

## 2019-12-04 DIAGNOSIS — N952 Postmenopausal atrophic vaginitis: Secondary | ICD-10-CM | POA: Diagnosis not present

## 2019-12-04 DIAGNOSIS — Z130 Encounter for screening for diseases of the blood and blood-forming organs and certain disorders involving the immune mechanism: Secondary | ICD-10-CM | POA: Diagnosis not present

## 2019-12-04 DIAGNOSIS — Z01419 Encounter for gynecological examination (general) (routine) without abnormal findings: Secondary | ICD-10-CM | POA: Diagnosis not present

## 2019-12-04 DIAGNOSIS — Z1151 Encounter for screening for human papillomavirus (HPV): Secondary | ICD-10-CM | POA: Diagnosis not present

## 2019-12-04 DIAGNOSIS — N9089 Other specified noninflammatory disorders of vulva and perineum: Secondary | ICD-10-CM | POA: Diagnosis not present

## 2019-12-04 DIAGNOSIS — Z780 Asymptomatic menopausal state: Secondary | ICD-10-CM | POA: Diagnosis not present

## 2019-12-04 DIAGNOSIS — Z124 Encounter for screening for malignant neoplasm of cervix: Secondary | ICD-10-CM | POA: Diagnosis not present

## 2019-12-26 DIAGNOSIS — Z1231 Encounter for screening mammogram for malignant neoplasm of breast: Secondary | ICD-10-CM | POA: Diagnosis not present

## 2020-03-05 DIAGNOSIS — H18832 Recurrent erosion of cornea, left eye: Secondary | ICD-10-CM | POA: Diagnosis not present

## 2020-03-05 DIAGNOSIS — H2513 Age-related nuclear cataract, bilateral: Secondary | ICD-10-CM | POA: Diagnosis not present

## 2020-03-05 DIAGNOSIS — H5203 Hypermetropia, bilateral: Secondary | ICD-10-CM | POA: Diagnosis not present

## 2020-04-30 DIAGNOSIS — J309 Allergic rhinitis, unspecified: Secondary | ICD-10-CM | POA: Diagnosis not present

## 2020-04-30 DIAGNOSIS — M79675 Pain in left toe(s): Secondary | ICD-10-CM | POA: Diagnosis not present

## 2020-04-30 DIAGNOSIS — E785 Hyperlipidemia, unspecified: Secondary | ICD-10-CM | POA: Diagnosis not present

## 2020-04-30 DIAGNOSIS — Z0000 Encounter for general adult medical examination without abnormal findings: Secondary | ICD-10-CM | POA: Diagnosis not present

## 2020-05-27 DIAGNOSIS — D2261 Melanocytic nevi of right upper limb, including shoulder: Secondary | ICD-10-CM | POA: Diagnosis not present

## 2020-05-27 DIAGNOSIS — L738 Other specified follicular disorders: Secondary | ICD-10-CM | POA: Diagnosis not present

## 2020-05-27 DIAGNOSIS — D2271 Melanocytic nevi of right lower limb, including hip: Secondary | ICD-10-CM | POA: Diagnosis not present

## 2020-05-27 DIAGNOSIS — D225 Melanocytic nevi of trunk: Secondary | ICD-10-CM | POA: Diagnosis not present

## 2020-07-16 DIAGNOSIS — Z01812 Encounter for preprocedural laboratory examination: Secondary | ICD-10-CM | POA: Diagnosis not present

## 2020-07-21 DIAGNOSIS — D122 Benign neoplasm of ascending colon: Secondary | ICD-10-CM | POA: Diagnosis not present

## 2020-07-21 DIAGNOSIS — Z1211 Encounter for screening for malignant neoplasm of colon: Secondary | ICD-10-CM | POA: Diagnosis not present

## 2020-08-13 DIAGNOSIS — H6121 Impacted cerumen, right ear: Secondary | ICD-10-CM | POA: Diagnosis not present

## 2020-12-29 DIAGNOSIS — Z1231 Encounter for screening mammogram for malignant neoplasm of breast: Secondary | ICD-10-CM | POA: Diagnosis not present

## 2020-12-29 DIAGNOSIS — Z130 Encounter for screening for diseases of the blood and blood-forming organs and certain disorders involving the immune mechanism: Secondary | ICD-10-CM | POA: Diagnosis not present

## 2020-12-29 DIAGNOSIS — Z6827 Body mass index (BMI) 27.0-27.9, adult: Secondary | ICD-10-CM | POA: Diagnosis not present

## 2020-12-29 DIAGNOSIS — Z780 Asymptomatic menopausal state: Secondary | ICD-10-CM | POA: Diagnosis not present

## 2020-12-29 DIAGNOSIS — Z01411 Encounter for gynecological examination (general) (routine) with abnormal findings: Secondary | ICD-10-CM | POA: Diagnosis not present

## 2020-12-29 DIAGNOSIS — N9089 Other specified noninflammatory disorders of vulva and perineum: Secondary | ICD-10-CM | POA: Diagnosis not present

## 2021-03-11 DIAGNOSIS — H18832 Recurrent erosion of cornea, left eye: Secondary | ICD-10-CM | POA: Diagnosis not present

## 2021-03-11 DIAGNOSIS — H2013 Chronic iridocyclitis, bilateral: Secondary | ICD-10-CM | POA: Diagnosis not present

## 2021-03-11 DIAGNOSIS — H5203 Hypermetropia, bilateral: Secondary | ICD-10-CM | POA: Diagnosis not present

## 2021-03-11 DIAGNOSIS — H25013 Cortical age-related cataract, bilateral: Secondary | ICD-10-CM | POA: Diagnosis not present

## 2021-05-25 DIAGNOSIS — Z0000 Encounter for general adult medical examination without abnormal findings: Secondary | ICD-10-CM | POA: Diagnosis not present

## 2021-05-25 DIAGNOSIS — E785 Hyperlipidemia, unspecified: Secondary | ICD-10-CM | POA: Diagnosis not present

## 2021-05-25 DIAGNOSIS — M79675 Pain in left toe(s): Secondary | ICD-10-CM | POA: Diagnosis not present

## 2021-05-25 DIAGNOSIS — J309 Allergic rhinitis, unspecified: Secondary | ICD-10-CM | POA: Diagnosis not present

## 2021-06-23 DIAGNOSIS — D225 Melanocytic nevi of trunk: Secondary | ICD-10-CM | POA: Diagnosis not present

## 2021-06-23 DIAGNOSIS — L738 Other specified follicular disorders: Secondary | ICD-10-CM | POA: Diagnosis not present

## 2021-06-23 DIAGNOSIS — D2261 Melanocytic nevi of right upper limb, including shoulder: Secondary | ICD-10-CM | POA: Diagnosis not present

## 2021-06-23 DIAGNOSIS — L820 Inflamed seborrheic keratosis: Secondary | ICD-10-CM | POA: Diagnosis not present

## 2021-06-23 DIAGNOSIS — L821 Other seborrheic keratosis: Secondary | ICD-10-CM | POA: Diagnosis not present

## 2021-06-23 DIAGNOSIS — D485 Neoplasm of uncertain behavior of skin: Secondary | ICD-10-CM | POA: Diagnosis not present

## 2022-01-07 DIAGNOSIS — F324 Major depressive disorder, single episode, in partial remission: Secondary | ICD-10-CM | POA: Diagnosis not present

## 2022-01-07 DIAGNOSIS — R413 Other amnesia: Secondary | ICD-10-CM | POA: Diagnosis not present

## 2022-02-09 ENCOUNTER — Other Ambulatory Visit: Payer: Self-pay | Admitting: Home Modifications

## 2022-02-09 DIAGNOSIS — E039 Hypothyroidism, unspecified: Secondary | ICD-10-CM | POA: Diagnosis not present

## 2022-02-09 DIAGNOSIS — E063 Autoimmune thyroiditis: Secondary | ICD-10-CM | POA: Diagnosis not present

## 2022-02-11 ENCOUNTER — Ambulatory Visit
Admission: RE | Admit: 2022-02-11 | Discharge: 2022-02-11 | Disposition: A | Discharge status: Home / Self Care | Payer: BC Managed Care – PPO | Source: Ambulatory Visit | Attending: Home Modifications | Admitting: Home Modifications

## 2022-02-11 DIAGNOSIS — E039 Hypothyroidism, unspecified: Secondary | ICD-10-CM | POA: Diagnosis not present

## 2022-02-11 DIAGNOSIS — E041 Nontoxic single thyroid nodule: Secondary | ICD-10-CM | POA: Diagnosis not present

## 2022-02-14 ENCOUNTER — Other Ambulatory Visit: Payer: Self-pay | Admitting: Home Modifications

## 2022-02-14 DIAGNOSIS — E041 Nontoxic single thyroid nodule: Secondary | ICD-10-CM

## 2022-02-15 DIAGNOSIS — F324 Major depressive disorder, single episode, in partial remission: Secondary | ICD-10-CM | POA: Diagnosis not present

## 2022-02-15 DIAGNOSIS — E063 Autoimmune thyroiditis: Secondary | ICD-10-CM | POA: Diagnosis not present

## 2022-02-15 DIAGNOSIS — R413 Other amnesia: Secondary | ICD-10-CM | POA: Diagnosis not present

## 2022-03-09 DIAGNOSIS — Z1231 Encounter for screening mammogram for malignant neoplasm of breast: Secondary | ICD-10-CM | POA: Diagnosis not present

## 2022-03-09 DIAGNOSIS — Z130 Encounter for screening for diseases of the blood and blood-forming organs and certain disorders involving the immune mechanism: Secondary | ICD-10-CM | POA: Diagnosis not present

## 2022-03-09 DIAGNOSIS — Z1389 Encounter for screening for other disorder: Secondary | ICD-10-CM | POA: Diagnosis not present

## 2022-03-09 DIAGNOSIS — Z01419 Encounter for gynecological examination (general) (routine) without abnormal findings: Secondary | ICD-10-CM | POA: Diagnosis not present

## 2022-03-09 DIAGNOSIS — Z780 Asymptomatic menopausal state: Secondary | ICD-10-CM | POA: Diagnosis not present

## 2022-03-14 DIAGNOSIS — H2513 Age-related nuclear cataract, bilateral: Secondary | ICD-10-CM | POA: Diagnosis not present

## 2022-03-14 DIAGNOSIS — H524 Presbyopia: Secondary | ICD-10-CM | POA: Diagnosis not present

## 2022-03-14 DIAGNOSIS — H5203 Hypermetropia, bilateral: Secondary | ICD-10-CM | POA: Diagnosis not present

## 2022-03-14 DIAGNOSIS — H25013 Cortical age-related cataract, bilateral: Secondary | ICD-10-CM | POA: Diagnosis not present

## 2022-03-23 DIAGNOSIS — E039 Hypothyroidism, unspecified: Secondary | ICD-10-CM | POA: Diagnosis not present

## 2022-03-30 ENCOUNTER — Ambulatory Visit
Admission: RE | Admit: 2022-03-30 | Discharge: 2022-03-30 | Disposition: A | Discharge status: Home / Self Care | Payer: BC Managed Care – PPO | Source: Ambulatory Visit | Attending: Home Modifications | Admitting: Home Modifications

## 2022-03-30 ENCOUNTER — Other Ambulatory Visit (HOSPITAL_COMMUNITY)
Admission: RE | Admit: 2022-03-30 | Discharge: 2022-03-30 | Disposition: A | Discharge status: Home / Self Care | Payer: BC Managed Care – PPO | Source: Ambulatory Visit | Attending: Physician Assistant | Admitting: Physician Assistant

## 2022-03-30 DIAGNOSIS — E041 Nontoxic single thyroid nodule: Secondary | ICD-10-CM | POA: Insufficient documentation

## 2022-04-04 LAB — CYTOLOGY - NON PAP

## 2022-05-13 DIAGNOSIS — E041 Nontoxic single thyroid nodule: Secondary | ICD-10-CM | POA: Diagnosis not present

## 2022-05-20 ENCOUNTER — Other Ambulatory Visit: Payer: Self-pay

## 2022-05-20 ENCOUNTER — Encounter (HOSPITAL_COMMUNITY): Payer: Self-pay | Admitting: General Surgery

## 2022-05-20 NOTE — Progress Notes (Signed)
Pt. Needs orders for surgery. 

## 2022-05-23 ENCOUNTER — Ambulatory Visit: Payer: Self-pay | Admitting: General Surgery

## 2022-05-24 ENCOUNTER — Ambulatory Visit (HOSPITAL_COMMUNITY): Payer: BC Managed Care – PPO | Admitting: Anesthesiology

## 2022-05-24 ENCOUNTER — Encounter (HOSPITAL_COMMUNITY): Payer: Self-pay | Admitting: General Surgery

## 2022-05-24 ENCOUNTER — Encounter (HOSPITAL_COMMUNITY)
Admission: RE | Disposition: A | Discharge status: Home / Self Care | Payer: Self-pay | Source: Home / Self Care | Attending: General Surgery

## 2022-05-24 ENCOUNTER — Observation Stay (HOSPITAL_COMMUNITY)
Admission: RE | Admit: 2022-05-24 | Discharge: 2022-05-25 | Disposition: A | Discharge status: Home / Self Care | Payer: BC Managed Care – PPO | Attending: General Surgery | Admitting: General Surgery

## 2022-05-24 DIAGNOSIS — E065 Other chronic thyroiditis: Secondary | ICD-10-CM | POA: Diagnosis not present

## 2022-05-24 DIAGNOSIS — C73 Malignant neoplasm of thyroid gland: Secondary | ICD-10-CM | POA: Diagnosis not present

## 2022-05-24 DIAGNOSIS — E039 Hypothyroidism, unspecified: Secondary | ICD-10-CM | POA: Insufficient documentation

## 2022-05-24 DIAGNOSIS — E041 Nontoxic single thyroid nodule: Secondary | ICD-10-CM | POA: Diagnosis not present

## 2022-05-24 DIAGNOSIS — Z01818 Encounter for other preprocedural examination: Secondary | ICD-10-CM

## 2022-05-24 DIAGNOSIS — E042 Nontoxic multinodular goiter: Secondary | ICD-10-CM | POA: Diagnosis not present

## 2022-05-24 DIAGNOSIS — Z79899 Other long term (current) drug therapy: Secondary | ICD-10-CM | POA: Insufficient documentation

## 2022-05-24 DIAGNOSIS — E079 Disorder of thyroid, unspecified: Secondary | ICD-10-CM | POA: Diagnosis present

## 2022-05-24 HISTORY — DX: Other complications of anesthesia, initial encounter: T88.59XA

## 2022-05-24 HISTORY — PX: THYROID LOBECTOMY: SHX420

## 2022-05-24 HISTORY — DX: Anemia, unspecified: D64.9

## 2022-05-24 LAB — CBC
HCT: 44.9 % (ref 36.0–46.0)
HCT: 41.3 % (ref 36.0–46.0)
Hemoglobin: 14.6 g/dL (ref 12.0–15.0)
Hemoglobin: 13.5 g/dL (ref 12.0–15.0)
MCH: 30 pg (ref 26.0–34.0)
MCH: 29.8 pg (ref 26.0–34.0)
MCHC: 32.5 g/dL (ref 30.0–36.0)
MCHC: 32.7 g/dL (ref 30.0–36.0)
MCV: 92.4 fL (ref 80.0–100.0)
MCV: 91.2 fL (ref 80.0–100.0)
Platelets: 206 10*3/uL (ref 150–400)
Platelets: 200 10*3/uL (ref 150–400)
RBC: 4.86 MIL/uL (ref 3.87–5.11)
RBC: 4.53 MIL/uL (ref 3.87–5.11)
RDW: 11.6 % (ref 11.5–15.5)
RDW: 11.5 % (ref 11.5–15.5)
WBC: 6.3 10*3/uL (ref 4.0–10.5)
WBC: 6.7 10*3/uL (ref 4.0–10.5)
nRBC: 0 % (ref 0.0–0.2)
nRBC: 0 % (ref 0.0–0.2)

## 2022-05-24 LAB — CREATININE, SERUM
Creatinine, Ser: 0.86 mg/dL (ref 0.44–1.00)
GFR, Estimated: >60 mL/min (ref >60)

## 2022-05-24 SURGERY — LOBECTOMY, THYROID
Anesthesia: General | Laterality: Left

## 2022-05-24 MED ORDER — PROPOFOL 10 MG/ML IV BOLUS
INTRAVENOUS | Status: AC
  Filled 2022-05-24: qty 20

## 2022-05-24 MED ORDER — ACETAMINOPHEN 500 MG PO TABS
1000.0000 mg | ORAL_TABLET | ORAL | Status: AC
  Administered 2022-05-24: 1000 mg via ORAL
  Filled 2022-05-24: qty 2

## 2022-05-24 MED ORDER — BUPIVACAINE-EPINEPHRINE (PF) 0.5% -1:200000 IJ SOLN
INTRAMUSCULAR | Status: AC
  Filled 2022-05-24: qty 30

## 2022-05-24 MED ORDER — PHENYLEPHRINE 80 MCG/ML (10ML) SYRINGE FOR IV PUSH (FOR BLOOD PRESSURE SUPPORT)
PREFILLED_SYRINGE | INTRAVENOUS | Status: DC | PRN
  Administered 2022-05-24: 160 ug via INTRAVENOUS
  Administered 2022-05-24 (×2): 80 ug via INTRAVENOUS

## 2022-05-24 MED ORDER — OXYCODONE HCL 5 MG PO TABS
5.0000 mg | ORAL_TABLET | ORAL | Status: DC | PRN
  Filled 2022-05-24: qty 1

## 2022-05-24 MED ORDER — ONDANSETRON HCL 4 MG/2ML IJ SOLN
INTRAMUSCULAR | Status: DC | PRN
  Administered 2022-05-24: 4 mg via INTRAVENOUS

## 2022-05-24 MED ORDER — PHENYLEPHRINE 80 MCG/ML (10ML) SYRINGE FOR IV PUSH (FOR BLOOD PRESSURE SUPPORT)
PREFILLED_SYRINGE | INTRAVENOUS | Status: AC
  Filled 2022-05-24: qty 10

## 2022-05-24 MED ORDER — ONDANSETRON 4 MG PO TBDP: 4.0000 mg | ORAL_TABLET | Freq: Four times a day (QID) | ORAL | Status: DC | PRN

## 2022-05-24 MED ORDER — 0.9 % SODIUM CHLORIDE (POUR BTL) OPTIME
TOPICAL | Status: DC | PRN
  Administered 2022-05-24: 1000 mL

## 2022-05-24 MED ORDER — CHLORHEXIDINE GLUCONATE CLOTH 2 % EX PADS: 6.0000 | MEDICATED_PAD | Freq: Once | CUTANEOUS | Status: DC

## 2022-05-24 MED ORDER — LORATADINE 10 MG PO TABS
10.0000 mg | ORAL_TABLET | Freq: Every day | ORAL | Status: DC
  Administered 2022-05-24: 10 mg via ORAL
  Filled 2022-05-24: qty 1

## 2022-05-24 MED ORDER — LIDOCAINE 2% (20 MG/ML) 5 ML SYRINGE
INTRAMUSCULAR | Status: DC | PRN
  Administered 2022-05-24: 80 mg via INTRAVENOUS

## 2022-05-24 MED ORDER — ENOXAPARIN SODIUM 40 MG/0.4ML IJ SOSY
40.0000 mg | PREFILLED_SYRINGE | INTRAMUSCULAR | Status: DC
  Administered 2022-05-25: 40 mg via SUBCUTANEOUS
  Filled 2022-05-24: qty 0.4

## 2022-05-24 MED ORDER — BUPIVACAINE-EPINEPHRINE 0.5% -1:200000 IJ SOLN
INTRAMUSCULAR | Status: DC | PRN
  Administered 2022-05-24: 9 mL

## 2022-05-24 MED ORDER — LEVOTHYROXINE SODIUM 50 MCG PO TABS
50.0000 ug | ORAL_TABLET | Freq: Every morning | ORAL | Status: DC
  Administered 2022-05-25: 50 ug via ORAL
  Filled 2022-05-24: qty 1

## 2022-05-24 MED ORDER — FENTANYL CITRATE PF 50 MCG/ML IJ SOSY
PREFILLED_SYRINGE | INTRAMUSCULAR | Status: AC
  Filled 2022-05-24: qty 1

## 2022-05-24 MED ORDER — ACETAMINOPHEN 500 MG PO TABS
1000.0000 mg | ORAL_TABLET | Freq: Four times a day (QID) | ORAL | Status: DC
  Administered 2022-05-24 – 2022-05-25 (×4): 1000 mg via ORAL
  Filled 2022-05-24 (×4): qty 2

## 2022-05-24 MED ORDER — PROMETHAZINE HCL 25 MG/ML IJ SOLN: 6.2500 mg | INTRAMUSCULAR | Status: DC | PRN

## 2022-05-24 MED ORDER — LACTATED RINGERS IV SOLN: INTRAVENOUS | Status: DC

## 2022-05-24 MED ORDER — SCOPOLAMINE 1 MG/3DAYS TD PT72
1.0000 | MEDICATED_PATCH | Freq: Once | TRANSDERMAL | Status: DC
  Administered 2022-05-24: 1.5 mg via TRANSDERMAL
  Filled 2022-05-24: qty 1

## 2022-05-24 MED ORDER — DEXAMETHASONE SODIUM PHOSPHATE 10 MG/ML IJ SOLN
INTRAMUSCULAR | Status: AC
  Filled 2022-05-24: qty 1

## 2022-05-24 MED ORDER — PROPOFOL 1000 MG/100ML IV EMUL
INTRAVENOUS | Status: AC
  Filled 2022-05-24: qty 100

## 2022-05-24 MED ORDER — CHLORHEXIDINE GLUCONATE 0.12 % MT SOLN
15.0000 mL | Freq: Once | OROMUCOSAL | Status: AC
  Administered 2022-05-24: 15 mL via OROMUCOSAL

## 2022-05-24 MED ORDER — DEXAMETHASONE SODIUM PHOSPHATE 10 MG/ML IJ SOLN
INTRAMUSCULAR | Status: DC | PRN
  Administered 2022-05-24: 5 mg via INTRAVENOUS

## 2022-05-24 MED ORDER — DIPHENHYDRAMINE HCL 50 MG/ML IJ SOLN: 12.5000 mg | Freq: Four times a day (QID) | INTRAMUSCULAR | Status: DC | PRN

## 2022-05-24 MED ORDER — ONDANSETRON HCL 4 MG/2ML IJ SOLN
4.0000 mg | Freq: Four times a day (QID) | INTRAMUSCULAR | Status: DC | PRN
  Administered 2022-05-24: 4 mg via INTRAVENOUS
  Filled 2022-05-24: qty 2

## 2022-05-24 MED ORDER — MIDAZOLAM HCL 2 MG/2ML IJ SOLN
INTRAMUSCULAR | Status: AC
  Filled 2022-05-24: qty 2

## 2022-05-24 MED ORDER — ORAL CARE MOUTH RINSE: 15.0000 mL | Freq: Once | OROMUCOSAL | Status: AC

## 2022-05-24 MED ORDER — TRAMADOL HCL 50 MG PO TABS
50.0000 mg | ORAL_TABLET | Freq: Four times a day (QID) | ORAL | Status: DC | PRN
  Administered 2022-05-24: 50 mg via ORAL
  Filled 2022-05-24: qty 1

## 2022-05-24 MED ORDER — CEFAZOLIN SODIUM-DEXTROSE 2-4 GM/100ML-% IV SOLN
2.0000 g | INTRAVENOUS | Status: AC
  Administered 2022-05-24: 2 g via INTRAVENOUS
  Filled 2022-05-24: qty 100

## 2022-05-24 MED ORDER — MIDAZOLAM HCL 2 MG/2ML IJ SOLN
INTRAMUSCULAR | Status: DC | PRN
  Administered 2022-05-24: 2 mg via INTRAVENOUS

## 2022-05-24 MED ORDER — PROPOFOL 500 MG/50ML IV EMUL
INTRAVENOUS | Status: DC | PRN
  Administered 2022-05-24: 25 ug/kg/min via INTRAVENOUS

## 2022-05-24 MED ORDER — CALCIUM CARBONATE 1250 (500 CA) MG PO TABS
1000.0000 mg | ORAL_TABLET | Freq: Two times a day (BID) | ORAL | Status: DC
  Administered 2022-05-25: 2500 mg via ORAL
  Filled 2022-05-24 (×2): qty 2

## 2022-05-24 MED ORDER — SUCCINYLCHOLINE CHLORIDE 200 MG/10ML IV SOSY
PREFILLED_SYRINGE | INTRAVENOUS | Status: AC
  Filled 2022-05-24: qty 10

## 2022-05-24 MED ORDER — FENTANYL CITRATE (PF) 250 MCG/5ML IJ SOLN
INTRAMUSCULAR | Status: AC
  Filled 2022-05-24: qty 5

## 2022-05-24 MED ORDER — LIDOCAINE HCL (PF) 2 % IJ SOLN
INTRAMUSCULAR | Status: AC
  Filled 2022-05-24: qty 5

## 2022-05-24 MED ORDER — PROPOFOL 10 MG/ML IV BOLUS
INTRAVENOUS | Status: DC | PRN
  Administered 2022-05-24: 10 mg via INTRAVENOUS
  Administered 2022-05-24: 20 mg via INTRAVENOUS
  Administered 2022-05-24: 10 mg via INTRAVENOUS
  Administered 2022-05-24: 20 mg via INTRAVENOUS
  Administered 2022-05-24: 10 mg via INTRAVENOUS
  Administered 2022-05-24: 20 mg via INTRAVENOUS
  Administered 2022-05-24: 130 mg via INTRAVENOUS

## 2022-05-24 MED ORDER — DIPHENHYDRAMINE HCL 12.5 MG/5ML PO ELIX: 12.5000 mg | ORAL_SOLUTION | Freq: Four times a day (QID) | ORAL | Status: DC | PRN

## 2022-05-24 MED ORDER — ONDANSETRON HCL 4 MG/2ML IJ SOLN
INTRAMUSCULAR | Status: AC
  Filled 2022-05-24: qty 2

## 2022-05-24 MED ORDER — ROCURONIUM BROMIDE 10 MG/ML (PF) SYRINGE
PREFILLED_SYRINGE | INTRAVENOUS | Status: AC
  Filled 2022-05-24: qty 10

## 2022-05-24 MED ORDER — MORPHINE SULFATE (PF) 2 MG/ML IV SOLN
2.0000 mg | INTRAVENOUS | Status: DC | PRN
  Administered 2022-05-24: 2 mg via INTRAVENOUS
  Filled 2022-05-24: qty 1

## 2022-05-24 MED ORDER — SUCCINYLCHOLINE CHLORIDE 200 MG/10ML IV SOSY
PREFILLED_SYRINGE | INTRAVENOUS | Status: DC | PRN
  Administered 2022-05-24: 100 mg via INTRAVENOUS

## 2022-05-24 MED ORDER — METOPROLOL TARTRATE 5 MG/5ML IV SOLN: 5.0000 mg | Freq: Four times a day (QID) | INTRAVENOUS | Status: DC | PRN

## 2022-05-24 MED ORDER — FENTANYL CITRATE PF 50 MCG/ML IJ SOSY
25.0000 ug | PREFILLED_SYRINGE | INTRAMUSCULAR | Status: DC | PRN
  Administered 2022-05-24 (×2): 50 ug via INTRAVENOUS

## 2022-05-24 MED ORDER — FENTANYL CITRATE (PF) 250 MCG/5ML IJ SOLN
INTRAMUSCULAR | Status: DC | PRN
  Administered 2022-05-24: 100 ug via INTRAVENOUS

## 2022-05-24 SURGICAL SUPPLY — 39 items
ADH SKN CLS APL DERMABOND .7 (GAUZE/BANDAGES/DRESSINGS) ×1
BAG COUNTER SPONGE SURGICOUNT (BAG) IMPLANT
BAG SPNG CNTER NS LX DISP (BAG)
BLADE SURG 15 STRL LF DISP TIS (BLADE) ×1 IMPLANT
BLADE SURG 15 STRL SS (BLADE) ×1
CHLORAPREP W/TINT 26 (MISCELLANEOUS) ×1 IMPLANT
CLIP TI MEDIUM 6 (CLIP) ×2 IMPLANT
CLIP TI WIDE RED SMALL 6 (CLIP) ×2 IMPLANT
COVER SURGICAL LIGHT HANDLE (MISCELLANEOUS) ×1 IMPLANT
DERMABOND ADVANCED .7 DNX12 (GAUZE/BANDAGES/DRESSINGS) ×1 IMPLANT
DISSECTOR SURG LIGASURE 21 (MISCELLANEOUS) IMPLANT
DRAPE LAPAROTOMY T 98X78 PEDS (DRAPES) ×1 IMPLANT
ELECT REM PT RETURN 15FT ADLT (MISCELLANEOUS) ×1 IMPLANT
GAUZE 4X4 16PLY ~~LOC~~+RFID DBL (SPONGE) ×1 IMPLANT
GLOVE BIOGEL PI IND STRL 7.0 (GLOVE) ×1 IMPLANT
GLOVE SURG SS PI 7.0 STRL IVOR (GLOVE) ×1 IMPLANT
GOWN STRL REUS W/ TWL LRG LVL3 (GOWN DISPOSABLE) ×1 IMPLANT
GOWN STRL REUS W/ TWL XL LVL3 (GOWN DISPOSABLE) IMPLANT
GOWN STRL REUS W/TWL LRG LVL3 (GOWN DISPOSABLE) ×1
GOWN STRL REUS W/TWL XL LVL3 (GOWN DISPOSABLE)
HEMOSTAT SURGICEL 2X4 FIBR (HEMOSTASIS) ×1 IMPLANT
ILLUMINATOR WAVEGUIDE N/F (MISCELLANEOUS) IMPLANT
KIT BASIN OR (CUSTOM PROCEDURE TRAY) ×1 IMPLANT
KIT TURNOVER KIT A (KITS) IMPLANT
NDL HYPO 25X1 1.5 SAFETY (NEEDLE) ×1 IMPLANT
NEEDLE HYPO 25X1 1.5 SAFETY (NEEDLE) ×1 IMPLANT
PACK GENERAL/GYN (CUSTOM PROCEDURE TRAY) ×1 IMPLANT
PROBE NERVBE PRASS .33 (MISCELLANEOUS) IMPLANT
SHEARS HARMONIC 9CM CVD (BLADE) IMPLANT
SUT MNCRL AB 4-0 PS2 18 (SUTURE) ×1 IMPLANT
SUT SILK 2 0 (SUTURE)
SUT SILK 2-0 18XBRD TIE 12 (SUTURE) IMPLANT
SUT SILK 3 0 (SUTURE)
SUT SILK 3-0 18XBRD TIE 12 (SUTURE) IMPLANT
SUT VIC AB 3-0 SH 18 (SUTURE) ×1 IMPLANT
SYR CONTROL 10ML LL (SYRINGE) ×1 IMPLANT
TOWEL OR 17X26 10 PK STRL BLUE (TOWEL DISPOSABLE) ×1 IMPLANT
TUBE ENDOTRACH  EMG 6MMTUBE EN (MISCELLANEOUS) ×1
TUBE ENDOTRACH EMG 6MMTUBE EN (MISCELLANEOUS) IMPLANT

## 2022-05-24 NOTE — Op Note (Signed)
Preoperative diagnosis: left thyroid nodule  Postoperative diagnosis: same   Procedure: left thyroid lobectomy with isthmusectomy  Surgeon: Gurney Maxin, M.D.  Asst: Verita Lamb, M.D.  Anesthesia: general  Indications for procedure: Joanne Smith is a 62 y.o. year old female with findings of left thyroid nodule. The nodule underwent biopsy resulting as Bethesda V. After discussion with the patient she decided to proceed with left thyroidectomy.  Description of procedure: The patient was brought to the operating room and placed in a supine position on the operating room table. Following administration of general anesthesia, the patient was positioned and then prepped and draped in the usual aseptic fashion.   A small Kocher incision was made. Dissection was carried through subcutaneous tissues and platysma. Hemostasis was achieved with the electrocautery. Skin flaps were elevated cephalad and caudad from the thyroid notch to the sternal notch. A Mahorner self-retaining retractor was placed for exposure. Strap muscles were incised in the midline and dissection was begun on the left side.  Strap muscles were reflected laterally. Left thyroid lobe was firm in the mid gland with some inflammatory changes to the lateral and posterior areas.  The left lobe was gently mobilized with blunt dissection. Superior pole vessels were dissected out and divided individually between small and medium ligaclips with the harmonic scalpel. The thyroid lobe was rolled anteriorly. Branches of the inferior thyroid artery were divided between small ligaclips with the harmonic scalpel. Inferior venous tributaries were divided between ligaclips. Both the superior and inferior parathyroid glands were identified and preserved on their vascular pedicles. The recurrent laryngeal nerve was identified and preserved along its course. The ligament of Gwenlyn Found was released with the electrocautery and the gland was mobilized onto the  anterior trachea. Isthmus was mobilized across the midline. There was pyramidal lobe present and removed with the isthmus. Dry pack was placed in the left neck.  Hemostats were placed on the right aspect of the isthmus and the thyroid sharply divided. The right edge was suture ligated.   The neck was irrigated with warm saline. Fibrillar was placed throughout the operative field. Strap muscles were approximated in the midline with interrupted 3-0 Vicryl sutures. Platysma was closed with interrupted 3-0 Vicryl sutures. Skin was closed with a running 4-0 Monocryl subcuticular suture. Wound was washed and Dermabond was applied. The patient was awakened from anesthesia and brought to the recovery room. The patient tolerated the procedure well.  Findings: fimness of left mid thyroid  Specimen: left thyroid  Implant: fibrillar   Blood loss: 20 ml  Local anesthesia:  10 ml marcaine   Complications: none  Gurney Maxin, M.D. General, Bariatric, & Minimally Invasive Surgery [facility]

## 2022-05-24 NOTE — Transfer of Care (Signed)
Immediate Anesthesia Transfer of Care Note  Patient: Joanne Smith  Procedure(s) Performed: LEFT THYROID LOBECTOMY (Left)  Patient Location: PACU  Anesthesia Type:General  Level of Consciousness: awake, drowsy, and patient cooperative  Airway & Oxygen Therapy: Patient Spontanous Breathing and Patient connected to face mask oxygen  Post-op Assessment: Report given to RN and Post -op Vital signs reviewed and stable  Post vital signs: Reviewed and stable  Last Vitals:  Vitals Value Taken Time  BP 114/77 05/24/22 1255  Temp    Pulse 61 05/24/22 1257  Resp 12 05/24/22 1257  SpO2 100 % 05/24/22 1257  Vitals shown include unvalidated device data.  Last Pain:  Vitals:   05/24/22 1001  TempSrc: Oral  PainSc: 0-No pain      Patients Stated Pain Goal: 4 (88/75/79 7282)  Complications: No notable events documented.

## 2022-05-24 NOTE — Anesthesia Postprocedure Evaluation (Signed)
Anesthesia Post Note  Patient: Joanne Smith  Procedure(s) Performed: LEFT THYROID LOBECTOMY (Left)     Patient location during evaluation: PACU Anesthesia Type: General Level of consciousness: awake and alert Pain management: pain level controlled Vital Signs Assessment: post-procedure vital signs reviewed and stable Respiratory status: spontaneous breathing, nonlabored ventilation, respiratory function stable and patient connected to nasal cannula oxygen Cardiovascular status: blood pressure returned to baseline and stable Postop Assessment: no apparent nausea or vomiting Anesthetic complications: no   No notable events documented.  Last Vitals:  Vitals:   05/24/22 1537 05/24/22 1640  BP: 118/67 117/69  Pulse: (!) 54 (!) 57  Resp: 14   Temp: 36.6 C 36.8 C  SpO2: 100% 100%    Last Pain:  Vitals:   05/24/22 1640  TempSrc: Oral  PainSc:                  Santa Lighter

## 2022-05-24 NOTE — Anesthesia Procedure Notes (Signed)
Procedure Name: Intubation Date/Time: 05/24/2022 11:31 AM  Performed by: Lollie Sails, CRNAPre-anesthesia Checklist: Patient identified, Emergency Drugs available, Suction available, Patient being monitored and Timeout performed Patient Re-evaluated:Patient Re-evaluated prior to induction Oxygen Delivery Method: Circle system utilized Preoxygenation: Pre-oxygenation with 100% oxygen Induction Type: IV induction Ventilation: Mask ventilation without difficulty Laryngoscope Size: Miller and 3 Grade View: Grade III Tube type: Oral (NIM EMG Tube) Tube size: 6.0 mm Number of attempts: 1 Airway Equipment and Method: Stylet Placement Confirmation: positive ETCO2 and breath sounds checked- equal and bilateral Secured at: 23 cm Tube secured with: Tape Dental Injury: Teeth and Oropharynx as per pre-operative assessment  Comments: Unable to visualize endotracheal tube passing through cords for proper placement - Dr. Kieth Brightly notified and tested for placement - correct placement confirmed.

## 2022-05-24 NOTE — Anesthesia Preprocedure Evaluation (Addendum)
Anesthesia Evaluation  Patient identified by MRN, date of birth, ID band Patient awake    Reviewed: Allergy & Precautions, NPO status , Patient's Chart, lab work & pertinent test results  Airway Mallampati: II  TM Distance: >3 FB Neck ROM: Full    Dental  (+) Teeth Intact, Dental Advisory Given   Pulmonary neg pulmonary ROS   Pulmonary exam normal breath sounds clear to auscultation       Cardiovascular Exercise Tolerance: Good negative cardio ROS Normal cardiovascular exam Rhythm:Regular Rate:Normal     Neuro/Psych  Headaches  negative psych ROS   GI/Hepatic negative GI ROS, Neg liver ROS,,,  Endo/Other  Hypothyroidism  LEFT THYROID CANCER  Renal/GU negative Renal ROS     Musculoskeletal negative musculoskeletal ROS (+)    Abdominal   Peds  Hematology  (+) Blood dyscrasia, anemia   Anesthesia Other Findings Day of surgery medications reviewed with the patient.  Reproductive/Obstetrics                             Anesthesia Physical Anesthesia Plan  ASA: 2  Anesthesia Plan: General   Post-op Pain Management: Tylenol PO (pre-op)*   Induction: Intravenous  PONV Risk Score and Plan: 4 or greater and Midazolam, Scopolamine patch - Pre-op, Dexamethasone and Ondansetron  Airway Management Planned: Oral ETT  Additional Equipment:   Intra-op Plan:   Post-operative Plan: Extubation in OR  Informed Consent: I have reviewed the patients History and Physical, chart, labs and discussed the procedure including the risks, benefits and alternatives for the proposed anesthesia with the patient or authorized representative who has indicated his/her understanding and acceptance.     Dental advisory given  Plan Discussed with: CRNA  Anesthesia Plan Comments:        Anesthesia Quick Evaluation

## 2022-05-24 NOTE — H&P (Signed)
Chief Complaint: New Consultation (Thyroid Eval. )   History of Present Illness: Joanne Smith is a 62 y.o. female who is seen today as an office consultation for evaluation of New Consultation (Thyroid Eval. ) .  She has been having some short term memory issues which has led her to be evaluated by her PCP. She had routine testing showing hypothyroidism and was started on levothyroxine. 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  Review of Systems: A complete review of systems was obtained from the patient. I have reviewed this information and discussed as appropriate with the patient. See HPI as well for other ROS.  Review of Systems  Constitutional: Negative.  HENT: Negative.  Eyes: Negative.  Respiratory: Negative.  Cardiovascular: Negative.  Gastrointestinal: Negative.  Genitourinary: Negative.  Musculoskeletal: Negative.  Skin: Negative.  Neurological: Negative.  Endo/Heme/Allergies: Negative.  Psychiatric/Behavioral: Negative.    Medical History: Past Medical History:  Diagnosis Date  Arrhythmia  Arthritis  Thyroid disease   There is no problem list on file for this patient.  Past Surgical History:  Procedure Laterality Date  CESAREAN SECTION  1987, 1990, 1994    Allergies  Allergen Reactions  Atorvastatin Unknown  Codeine Unknown  Doxycycline Unknown  Hydrocodone-Guaifenesin Unknown  Cough syrup  Naproxen Unknown  Omega-3 Acid Ethyl Esters Unknown  Pseudoephedrine Hcl Unknown   Current Outpatient Medications on File Prior to Visit  Medication Sig Dispense Refill  levothyroxine (SYNTHROID) 50 MCG tablet TAKE 1 TABLET IN THE MORNING ON AN EMPTY STOMACH ONCE A DAY  loratadine (CLARITIN REDITABS) 10 mg dissolvable tablet Take 10 mg by mouth once daily   No current facility-administered medications on file prior to visit.   History reviewed. No pertinent family  history.   Social History   Tobacco Use  Smoking Status Never  Smokeless Tobacco Never    Social History   Socioeconomic History  Marital status: Widowed  Tobacco Use  Smoking status: Never  Smokeless tobacco: Never  Substance and Sexual Activity  Alcohol use: Yes  Drug use: Never   Objective:   Vitals:  05/13/22 1026 05/13/22 1029  BP: 124/80  Pulse: 58  Temp: 36.2 C (97.1 F)  SpO2: 97%  Weight: 69.1 kg (152 lb 6.4 oz)  Height: 160 cm ('5\' 3"'$ )  PainSc: 0-No pain  PainLoc: Neck   Body mass index is 27 kg/m.  Physical Exam Constitutional:  Appearance: Normal appearance.  HENT:  Head: Normocephalic and atraumatic.  Neck:  Comments: No palpable lymph nodes or masses Pulmonary:  Effort: Pulmonary effort is normal.  Musculoskeletal:  General: Normal range of motion.  Cervical back: Normal range of motion.  Neurological:  General: No focal deficit present.  Mental Status: She is alert and oriented to person, place, and time. Mental status is at baseline.  Psychiatric:  Mood and Affect: Mood normal.  Behavior: Behavior normal.  Thought Content: Thought content normal.     Labs, Imaging and Diagnostic Testing: FNA results reviewed. [place] reviewed.  Assessment and Plan:   Diagnoses and all orders for this visit:  Thyroid nodule  Plan for left thyroidectomy. Discussed findings and how they fit into the greater diagnosis and treatment of thyroid cancer. Discussed possibility of completion thyroidectomy or adjuvant therapy and plan for life long levothyroxine.  The anatomy and physiology of the thyroid gland and organs of the neck were discussed. Pathophysiology of thyroid problems were discussed. Options were discussed, and  I made a recommendation to remove part (and possibly all) of the thyroid gland to treat the pathology.   Risks of bleeding, infection, injury to other organs including nerves, parathyroid glands, reoperation, death, and other risks were  discussed. I noted a good likelihood this will help address the problem. While there are risks, I feel the risks of nonoperative management are greater; therefore, I feel surgery offers the best option. Educational material was given to help further explain the topics & concerns from our discussion. We will work to minimize complications.

## 2022-05-25 ENCOUNTER — Encounter (HOSPITAL_COMMUNITY): Payer: Self-pay | Admitting: General Surgery

## 2022-05-25 ENCOUNTER — Other Ambulatory Visit: Payer: Self-pay

## 2022-05-25 DIAGNOSIS — C73 Malignant neoplasm of thyroid gland: Secondary | ICD-10-CM | POA: Diagnosis not present

## 2022-05-25 DIAGNOSIS — E041 Nontoxic single thyroid nodule: Secondary | ICD-10-CM | POA: Diagnosis not present

## 2022-05-25 DIAGNOSIS — E039 Hypothyroidism, unspecified: Secondary | ICD-10-CM | POA: Diagnosis not present

## 2022-05-25 DIAGNOSIS — Z79899 Other long term (current) drug therapy: Secondary | ICD-10-CM | POA: Diagnosis not present

## 2022-05-25 LAB — BASIC METABOLIC PANEL
Anion gap: 8 (ref 5–15)
BUN: 13 mg/dL (ref 8–23)
CO2: 25 mmol/L (ref 22–32)
Calcium: 9.2 mg/dL (ref 8.9–10.3)
Chloride: 104 mmol/L (ref 98–111)
Creatinine, Ser: 0.84 mg/dL (ref 0.44–1.00)
GFR, Estimated: >60 mL/min (ref >60)
Glucose, Bld: 112 mg/dL — ABNORMAL HIGH (ref 70–99)
Potassium: 4.2 mmol/L (ref 3.5–5.1)
Sodium: 137 mmol/L (ref 135–145)

## 2022-05-25 LAB — CBC
HCT: 40.9 % (ref 36.0–46.0)
Hemoglobin: 13.5 g/dL (ref 12.0–15.0)
MCH: 30.3 pg (ref 26.0–34.0)
MCHC: 33 g/dL (ref 30.0–36.0)
MCV: 91.9 fL (ref 80.0–100.0)
Platelets: 216 10*3/uL (ref 150–400)
RBC: 4.45 MIL/uL (ref 3.87–5.11)
RDW: 11.4 % — ABNORMAL LOW (ref 11.5–15.5)
WBC: 11.8 10*3/uL — ABNORMAL HIGH (ref 4.0–10.5)
nRBC: 0 % (ref 0.0–0.2)

## 2022-05-25 LAB — SURGICAL PATHOLOGY

## 2022-05-25 MED ORDER — TRAMADOL HCL 50 MG PO TABS: 50.0000 mg | ORAL_TABLET | Freq: Four times a day (QID) | ORAL | 0 refills | Status: DC | PRN

## 2022-05-25 MED ORDER — CALCIUM CARBONATE 1250 (500 CA) MG PO TABS: 2.0000 | ORAL_TABLET | Freq: Every day | ORAL | 0 refills | Status: DC

## 2022-05-25 NOTE — Progress Notes (Signed)
Patient was given discharge instructions, and all questions were answered. Patient was stable for discharge and was walked to the main exit. 

## 2022-05-25 NOTE — Discharge Instructions (Signed)
[facility], [place] [phone]  THYROID/ PARATHYROID SURGERY: POST OP INSTRUCTIONS  Always review your discharge instruction sheet given to you by the facility where your surgery was performed.  IF YOU HAVE DISABILITY OR FAMILY LEAVE FORMS, YOU MUST BRING THEM TO THE OFFICE FOR PROCESSING.  PLEASE DO NOT GIVE THEM TO YOUR DOCTOR.  A prescription for pain medication may be given to you upon discharge.  Take your pain medication as prescribed, if needed.  If narcotic pain medicine is not needed, then you may take acetaminophen (Tylenol) or ibuprofen (Advil) as needed. Take your usually prescribed medications unless otherwise directed. If you need a refill on your pain medication, please contact your pharmacy. They will contact our office to request authorization.  Prescriptions will not be filled after 5pm or on week-ends. You should follow a light diet the first 24 hours after arrival home, such as soup and crackers, etc.  Be sure to include lots of fluids daily.  Resume your normal diet the day after surgery. Most patients will experience some swelling and bruising on the chest and neck area.  Ice packs will help.  Swelling and bruising can take several days to resolve.  It is common to experience some constipation if taking pain medication after surgery.  Increasing fluid intake and taking a stool softener will usually help or prevent this problem from occurring.  A mild laxative (Milk of Magnesia or Miralax) should be taken according to package directions if there are no bowel movements after 48 hours. Unless discharge instructions indicate otherwise, you may remove your bandages 24-48 hours after surgery, and you may shower at that time.  You may have steri-strips (small skin tapes) in place directly over the incision.  These strips should be left on the skin for 7-10 days.  If your surgeon used skin glue on the incision, you may shower in 24 hours.  The glue will flake off over  the next 2-3 weeks.  Any sutures or staples will be removed at the office during your follow-up visit. ACTIVITIES:  You may resume regular (light) daily activities beginning the next day--such as daily self-care, walking, climbing stairs--gradually increasing activities as tolerated.  You may have sexual intercourse when it is comfortable.  Refrain from any heavy lifting or straining until approved by your doctor. You may drive when you no longer are taking prescription pain medication, you can comfortably wear a seatbelt, and you can safely maneuver your car and apply brakes RETURN TO WORK:  __________________________________________________________ Dennis Bast should see your doctor in the office for a follow-up appointment approximately two weeks after your surgery.  Make sure that you call for this appointment within a day or two after you arrive home to insure a convenient appointment time. OTHER INSTRUCTIONS: ____________________________________________________________________________ _________________________________________________________________________________________________________________ _________________________________________________________________________________________________________________   WHEN TO CALL YOUR DOCTOR: Fever over 101.0 Inability to urinate Nausea and/or vomiting Extreme swelling or bruising Continued bleeding from incision. Increased pain, redness, or drainage from the incision. Difficulty swallowing or breathing Muscle cramping or spasms. Numbness or tingling in hands or feet or around lips.  The clinic staff is available to answer your questions during regular business hours.  Please don't hesitate to call and ask to speak to one of the nurses if you have concerns.  For further questions, please visit [email or web address]

## 2022-05-25 NOTE — Discharge Summary (Signed)
Physician Discharge Summary  Joanne Smith UXN:[id number] DOB: 08-25-1960 DOA: 05/24/2022  PCP: Caren Macadam, MD  Admit date: 05/24/2022 Discharge date: 05/25/2022  Recommendations for Outpatient Follow-up:  none (include homehealth, outpatient follow-up instructions, specific recommendations for PCP to follow-up on, etc.)   Discharge Diagnoses:  Principal Problem:   Thyroid mass   Surgical Procedure: left thyroidectomy  Discharge Condition: Good Disposition: Home  Diet recommendation: reg diet   Hospital Course:  62 yo female underwent left thyroidectomy. She was observed overnight. She did well. She had good pain control. She ambulated. She tolerated a diet. She was discharged home POD 1.  Discharge Instructions  Discharge Instructions     Call MD for:  difficulty breathing, headache or visual disturbances   Complete by: As directed    Call MD for:  persistant nausea and vomiting   Complete by: As directed    Call MD for:  redness, tenderness, or signs of infection (pain, swelling, redness, odor or green/yellow discharge around incision site)   Complete by: As directed    Call MD for:  severe uncontrolled pain   Complete by: As directed    Call MD for:  temperature >100.4   Complete by: As directed    Diet - low sodium heart healthy   Complete by: As directed    Increase activity slowly   Complete by: As directed       Allergies as of 05/25/2022       Reactions   Codeine Other (See Comments)   Unknown   Doxycycline Other (See Comments)   Unknown   Guaifenesin-codeine Other (See Comments)   Cough syrup   Hydrocodone-guaifenesin    Cough syrup   Lipitor [atorvastatin] Other (See Comments)   Unknown   Lovaza [omega-3-acid Ethyl Esters]    Naproxen Other (See Comments)   Unknown   Sulfonamide Derivatives Other (See Comments)   Unknown        Medication List     TAKE these medications    calcium carbonate 1250 (500 Ca) MG tablet Commonly known  as: OS-CAL - dosed in mg of elemental calcium Take 2 tablets (2,500 mg total) by mouth daily with breakfast.   levothyroxine 50 MCG tablet Commonly known as: SYNTHROID Take 50 mcg by mouth every morning.   loratadine-pseudoephedrine 10-240 MG 24 hr tablet Commonly known as: CLARITIN-D 24-hour Take 1 tablet by mouth at bedtime.   traMADol 50 MG tablet Commonly known as: ULTRAM Take 1 tablet (50 mg total) by mouth every 6 (six) hours as needed (mild pain).          The results of significant diagnostics from this hospitalization (including imaging, microbiology, ancillary and laboratory) are listed below for reference.    Significant Diagnostic Studies: No results found.  Labs: Basic Metabolic Panel: Recent Labs  Lab 05/24/22 1430 05/25/22 0448  NA  --  137  K  --  4.2  CL  --  104  CO2  --  25  GLUCOSE  --  112*  BUN  --  13  CREATININE 0.86 0.84  CALCIUM  --  9.2   Liver Function Tests: No results for input(s): "AST", "ALT", "ALKPHOS", "BILITOT", "PROT", "ALBUMIN" in the last 168 hours.  CBC: Recent Labs  Lab 05/24/22 1015 05/24/22 1430 05/25/22 0448  WBC 6.3 6.7 11.8*  HGB 14.6 13.5 13.5  HCT 44.9 41.3 40.9  MCV 92.4 91.2 91.9  PLT 206 200 216    CBG: No results for input(s): "GLUCAP"  in the last 168 hours.  Principal Problem:   Thyroid mass   Time coordinating discharge: 15 min

## 2022-05-27 LAB — CALCIUM, IONIZED: Calcium, Ionized, Serum: 4.9 mg/dL (ref 4.5–5.6)

## 2022-06-01 ENCOUNTER — Telehealth: Payer: Self-pay | Admitting: General Surgery

## 2022-06-01 DIAGNOSIS — C73 Malignant neoplasm of thyroid gland: Secondary | ICD-10-CM

## 2022-06-01 NOTE — Telephone Encounter (Signed)
I discussed with Joanne Smith her recent pathology. 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

## 2022-06-16 ENCOUNTER — Other Ambulatory Visit: Payer: Self-pay | Admitting: Family Medicine

## 2022-06-16 DIAGNOSIS — Z780 Asymptomatic menopausal state: Secondary | ICD-10-CM

## 2022-06-16 DIAGNOSIS — M8580 Other specified disorders of bone density and structure, unspecified site: Secondary | ICD-10-CM

## 2022-06-16 DIAGNOSIS — Z0000 Encounter for general adult medical examination without abnormal findings: Secondary | ICD-10-CM | POA: Diagnosis not present

## 2022-06-16 DIAGNOSIS — E782 Mixed hyperlipidemia: Secondary | ICD-10-CM | POA: Diagnosis not present

## 2022-06-16 DIAGNOSIS — L608 Other nail disorders: Secondary | ICD-10-CM | POA: Diagnosis not present

## 2022-06-16 DIAGNOSIS — E890 Postprocedural hypothyroidism: Secondary | ICD-10-CM | POA: Diagnosis not present

## 2022-06-23 ENCOUNTER — Ambulatory Visit (INDEPENDENT_AMBULATORY_CARE_PROVIDER_SITE_OTHER): Payer: Self-pay | Admitting: Podiatry

## 2022-06-23 DIAGNOSIS — L603 Nail dystrophy: Secondary | ICD-10-CM | POA: Diagnosis not present

## 2022-06-23 DIAGNOSIS — B351 Tinea unguium: Secondary | ICD-10-CM

## 2022-06-23 MED ORDER — TERBINAFINE HCL 250 MG PO TABS: 250.0000 mg | ORAL_TABLET | Freq: Every day | ORAL | 2 refills | Status: AC

## 2022-06-23 MED ORDER — CICLOPIROX 8 % EX SOLN: Freq: Every day | CUTANEOUS | 0 refills | Status: DC

## 2022-06-23 NOTE — Progress Notes (Signed)
Subjective:  Patient ID: Joanne Smith, female    DOB: [birth year],  MRN: [id number]  Chief Complaint  Patient presents with   Nail Problem    Patient was referred by her pcp due to nail fungus. Nails are discolored. Patient states that she is currently taking Cephalexin for her nail fungus.    62 y.o. female presents with concern for nail fungus.  She also has an area of redness around the right hallux nail base.  She has been taking cephalexin for this given to her by her PCP.  She has not taken any antifungal medications for the nail fungal infection prior to this visit.  Denies any pain with any of the nails on either foot.  Denies any drainage or swelling does have mild redness of the right great toe but no nausea vomiting fever chills.  Past Medical History:  Diagnosis Date   Anemia    Complication of anesthesia    , 1987 difficult to wake up   Eczema    Herpes    Migraine    Seasonal allergies     Allergies  Allergen Reactions   Codeine Other (See Comments)    Unknown   Doxycycline Other (See Comments)    Unknown   Guaifenesin-Codeine Other (See Comments)    Cough syrup   Hydrocodone-Guaifenesin     Cough syrup   Lipitor [Atorvastatin] Other (See Comments)    Unknown   Lovaza [Omega-3-Acid Ethyl Esters]    Naproxen Other (See Comments)    Unknown   Sulfonamide Derivatives Other (See Comments)    Unknown    ROS: Negative except as per HPI above  Objective:  General: AAO x3, NAD  Dermatological: With inspection of right hallux nail there is intermediate discoloration thickening dystrophic growth longitudinal ridging especially on the medial border of the nail.  At the nail base there is mild inflammation and erythema however no active drainage.  The nail is nontender to palpation.  Vascular:  Dorsalis Pedis artery and Posterior Tibial artery pedal pulses are 2/4 bilateral.  Capillary fill time < 3 sec to all digits.   Neruologic: Grossly intact via light  touch bilateral. Protective threshold intact to all sites bilateral.   Musculoskeletal: No gross boney pedal deformities bilateral. No pain, crepitus, or limitation noted with foot and ankle range of motion bilateral. Muscular strength 5/5 in all groups tested bilateral.  Gait: Unassisted, Nonantalgic.   No images are attached to the encounter.  Assessment:   1. Onychomycosis   2. Nail dystrophy      Plan:  Patient was evaluated and treated and all questions answered.  Onychomycosis -Educated on etiology of nail fungus. -Defer nail sample for pathology at this time -Baseline liver function studies ordered. Will d/c terbinafine if elevated during therapy. -eRx for oral terbinafine 250 mg take once daily for 90 days #30 2 refill. Educated on risks and benefits of the medication including risk of liver toxicity. -E Rx for Penlac 8% solution applied topically to all nails once daily for the next 90 days. -Discussed possible ingrown nail removal should the redness around the right hallux nail worsen however I do not feel that it is significantly painful enough to warrant total nail removal at this time. Recommend she continue to finish any course of antibiotics previously prescribed including the cephalexin that she is currently taking. -Recommend Epsom salt soaks daily and monitor for worsening of redness or any drainage or if pain begins in the right great toe.  If this occurs encourage patient to call to get in sooner than 3 months so that we may remove the right hallux nail.   Return in about 3 months (around 09/21/2022) for f/u onychomycosis.          Everitt Amber, DPM [facility]

## 2022-06-24 LAB — HEPATIC FUNCTION PANEL
AG Ratio: 1.6 (calc) (ref 1.0–2.5)
ALT: 8 U/L (ref 6–29)
AST: 17 U/L (ref 10–35)
Albumin: 4.5 g/dL (ref 3.6–5.1)
Alkaline phosphatase (APISO): 55 U/L (ref 37–153)
Bilirubin, Direct: 0.1 mg/dL (ref 0.0–0.2)
Globulin: 2.9 g/dL (calc) (ref 1.9–3.7)
Indirect Bilirubin: 0.4 mg/dL (calc) (ref 0.2–1.2)
Total Bilirubin: 0.5 mg/dL (ref 0.2–1.2)
Total Protein: 7.4 g/dL (ref 6.1–8.1)

## 2022-07-21 ENCOUNTER — Other Ambulatory Visit: Payer: BLUE CROSS/BLUE SHIELD

## 2022-07-27 DIAGNOSIS — L603 Nail dystrophy: Secondary | ICD-10-CM | POA: Diagnosis not present

## 2022-07-27 DIAGNOSIS — L821 Other seborrheic keratosis: Secondary | ICD-10-CM | POA: Diagnosis not present

## 2022-07-27 DIAGNOSIS — D2271 Melanocytic nevi of right lower limb, including hip: Secondary | ICD-10-CM | POA: Diagnosis not present

## 2022-08-03 DIAGNOSIS — E039 Hypothyroidism, unspecified: Secondary | ICD-10-CM | POA: Diagnosis not present

## 2022-08-10 DIAGNOSIS — E039 Hypothyroidism, unspecified: Secondary | ICD-10-CM | POA: Diagnosis not present

## 2022-08-10 DIAGNOSIS — Z85850 Personal history of malignant neoplasm of thyroid: Secondary | ICD-10-CM | POA: Diagnosis not present

## 2022-08-10 DIAGNOSIS — E041 Nontoxic single thyroid nodule: Secondary | ICD-10-CM | POA: Diagnosis not present

## 2022-09-14 DIAGNOSIS — M8580 Other specified disorders of bone density and structure, unspecified site: Secondary | ICD-10-CM | POA: Diagnosis not present

## 2022-09-14 DIAGNOSIS — E890 Postprocedural hypothyroidism: Secondary | ICD-10-CM | POA: Diagnosis not present

## 2022-09-14 DIAGNOSIS — B351 Tinea unguium: Secondary | ICD-10-CM | POA: Diagnosis not present

## 2022-09-14 DIAGNOSIS — E785 Hyperlipidemia, unspecified: Secondary | ICD-10-CM | POA: Diagnosis not present

## 2022-09-21 DIAGNOSIS — E039 Hypothyroidism, unspecified: Secondary | ICD-10-CM | POA: Diagnosis not present

## 2022-11-14 DIAGNOSIS — E7800 Pure hypercholesterolemia, unspecified: Secondary | ICD-10-CM | POA: Diagnosis not present

## 2022-11-14 DIAGNOSIS — E890 Postprocedural hypothyroidism: Secondary | ICD-10-CM | POA: Diagnosis not present

## 2022-12-19 ENCOUNTER — Ambulatory Visit
Admission: RE | Admit: 2022-12-19 | Discharge: 2022-12-19 | Disposition: A | Discharge status: Home / Self Care | Payer: BC Managed Care – PPO | Source: Ambulatory Visit | Attending: Family Medicine | Admitting: Family Medicine

## 2022-12-19 DIAGNOSIS — E349 Endocrine disorder, unspecified: Secondary | ICD-10-CM | POA: Diagnosis not present

## 2022-12-19 DIAGNOSIS — M8580 Other specified disorders of bone density and structure, unspecified site: Secondary | ICD-10-CM

## 2022-12-19 DIAGNOSIS — Z780 Asymptomatic menopausal state: Secondary | ICD-10-CM

## 2022-12-19 DIAGNOSIS — M8588 Other specified disorders of bone density and structure, other site: Secondary | ICD-10-CM | POA: Diagnosis not present

## 2022-12-19 DIAGNOSIS — N958 Other specified menopausal and perimenopausal disorders: Secondary | ICD-10-CM | POA: Diagnosis not present

## 2023-01-17 DIAGNOSIS — Z23 Encounter for immunization: Secondary | ICD-10-CM | POA: Diagnosis not present

## 2023-01-17 DIAGNOSIS — M8580 Other specified disorders of bone density and structure, unspecified site: Secondary | ICD-10-CM | POA: Diagnosis not present

## 2023-01-17 DIAGNOSIS — E890 Postprocedural hypothyroidism: Secondary | ICD-10-CM | POA: Diagnosis not present

## 2023-01-17 DIAGNOSIS — E785 Hyperlipidemia, unspecified: Secondary | ICD-10-CM | POA: Diagnosis not present

## 2023-08-22 ENCOUNTER — Other Ambulatory Visit: Payer: Self-pay | Admitting: Family Medicine

## 2023-08-22 DIAGNOSIS — E890 Postprocedural hypothyroidism: Secondary | ICD-10-CM | POA: Diagnosis not present

## 2023-08-22 DIAGNOSIS — Z23 Encounter for immunization: Secondary | ICD-10-CM | POA: Diagnosis not present

## 2023-08-22 DIAGNOSIS — R413 Other amnesia: Secondary | ICD-10-CM | POA: Diagnosis not present

## 2023-08-22 DIAGNOSIS — M8580 Other specified disorders of bone density and structure, unspecified site: Secondary | ICD-10-CM | POA: Diagnosis not present

## 2023-08-22 DIAGNOSIS — Z0000 Encounter for general adult medical examination without abnormal findings: Secondary | ICD-10-CM | POA: Diagnosis not present

## 2023-08-22 DIAGNOSIS — E559 Vitamin D deficiency, unspecified: Secondary | ICD-10-CM | POA: Diagnosis not present

## 2023-08-22 DIAGNOSIS — E782 Mixed hyperlipidemia: Secondary | ICD-10-CM | POA: Diagnosis not present

## 2023-08-31 ENCOUNTER — Encounter: Payer: Self-pay | Admitting: Podiatry

## 2023-08-31 ENCOUNTER — Ambulatory Visit: Admitting: Podiatry

## 2023-08-31 ENCOUNTER — Encounter: Payer: Self-pay | Admitting: Family Medicine

## 2023-08-31 DIAGNOSIS — Q828 Other specified congenital malformations of skin: Secondary | ICD-10-CM

## 2023-09-02 ENCOUNTER — Ambulatory Visit
Admission: RE | Admit: 2023-09-02 | Discharge: 2023-09-02 | Disposition: A | Discharge status: Home / Self Care | Source: Ambulatory Visit | Attending: Family Medicine | Admitting: Family Medicine

## 2023-09-02 DIAGNOSIS — R413 Other amnesia: Secondary | ICD-10-CM

## 2023-09-03 NOTE — Progress Notes (Signed)
 Subjective:   Patient ID: Joanne Smith, female   DOB: 63 y.o.   MRN: [id number]   HPI Patient presents with 2 chronic lesions plantar aspect left that are painful when pressed and make walking difficult.  States that they have been present for a number of months   ROS      Objective:  Physical Exam  Neurovascular status intact with keratotic lesion x 2 left that are painful and make walking difficult with lucent cores upon debridement     Assessment:  Probability for porokeratotic lesion x 2 left     Plan:  Debridement of lesions accomplished no iatrogenic bleeding reappoint routine care

## 2023-10-06 DIAGNOSIS — D225 Melanocytic nevi of trunk: Secondary | ICD-10-CM | POA: Diagnosis not present

## 2023-10-06 DIAGNOSIS — L821 Other seborrheic keratosis: Secondary | ICD-10-CM | POA: Diagnosis not present

## 2023-10-06 DIAGNOSIS — L738 Other specified follicular disorders: Secondary | ICD-10-CM | POA: Diagnosis not present

## 2023-10-06 DIAGNOSIS — D2261 Melanocytic nevi of right upper limb, including shoulder: Secondary | ICD-10-CM | POA: Diagnosis not present

## 2023-10-12 DIAGNOSIS — Z1231 Encounter for screening mammogram for malignant neoplasm of breast: Secondary | ICD-10-CM | POA: Diagnosis not present

## 2023-10-12 DIAGNOSIS — Z130 Encounter for screening for diseases of the blood and blood-forming organs and certain disorders involving the immune mechanism: Secondary | ICD-10-CM | POA: Diagnosis not present

## 2023-10-12 DIAGNOSIS — Z01419 Encounter for gynecological examination (general) (routine) without abnormal findings: Secondary | ICD-10-CM | POA: Diagnosis not present

## 2023-10-12 DIAGNOSIS — I6381 Other cerebral infarction due to occlusion or stenosis of small artery: Secondary | ICD-10-CM | POA: Diagnosis not present

## 2023-12-25 ENCOUNTER — Telehealth: Payer: Self-pay | Admitting: Neurology

## 2023-12-25 ENCOUNTER — Ambulatory Visit: Admitting: Neurology

## 2023-12-25 NOTE — Telephone Encounter (Signed)
 Pt's daughter has r/s appointment due to provider not being available this afternoon

## 2023-12-28 ENCOUNTER — Encounter: Payer: Self-pay | Admitting: *Deleted

## 2023-12-29 ENCOUNTER — Encounter: Payer: Self-pay | Admitting: Neurology

## 2023-12-29 ENCOUNTER — Ambulatory Visit (INDEPENDENT_AMBULATORY_CARE_PROVIDER_SITE_OTHER): Admitting: Neurology

## 2023-12-29 VITALS — BP 134/89 | HR 79 | Ht 63.0 in | Wt 140.0 lb

## 2023-12-29 DIAGNOSIS — R413 Other amnesia: Secondary | ICD-10-CM

## 2023-12-29 DIAGNOSIS — I6381 Other cerebral infarction due to occlusion or stenosis of small artery: Secondary | ICD-10-CM | POA: Diagnosis not present

## 2023-12-29 DIAGNOSIS — Z820 Family history of epilepsy and other diseases of the nervous system: Secondary | ICD-10-CM | POA: Diagnosis not present

## 2023-12-29 DIAGNOSIS — F439 Reaction to severe stress, unspecified: Secondary | ICD-10-CM

## 2023-12-29 DIAGNOSIS — I6789 Other cerebrovascular disease: Secondary | ICD-10-CM

## 2023-12-29 DIAGNOSIS — G479 Sleep disorder, unspecified: Secondary | ICD-10-CM

## 2023-12-29 NOTE — Progress Notes (Signed)
 Subjective:    Patient ID: Joanne Smith, female    DOB: 05-25-1960, 63 y.o.   MRN: [id number]  HPI    True Mar, MD, PhD [facility] [address], [address] [address] [place], [place] [address]  Dear Dr. Rolinda,  I saw your patient, Joanne Smith, upon your kind request in my neurologic clinic today for evaluation of her memory decline.  The patient is accompanied by her youngest daughter today.  As you know, [name] is a 63 year old female with an underlying medical history of eczema, allergies, anemia, migraine headaches, hyperlipidemia, hypothyroidism, osteopenia, orthostatic hypotension, chest pain, and depression, who reports an approximately 2-year history of forgetfulness, particularly issues with short-term memory.  The patient is widowed for 9 years and lives currently alone but her older sister lives with her.  Patient is essentially a sister's caretaker as sister has a lot of medical problems from what I understand.  Patient is the youngest of 3 girls, her older sister recently passed away at the age of 8, she did not have any memory loss.  Her middle sister does not have any memory loss.  Mom had a diagnosis of Alzheimer's dementia and died in her 24s, her father died in his 33s from a heart attack.  Patient is a non-smoker, she does not drink much in the way of alcohol, only rarely, on special occasions.  She does not drink a whole lot of water, estimates that she drinks about 2-3 bottles of water per day, caffeine in the form of coffee, 1 cup or less per day and occasional soda.  She has not fallen, she has not had any sudden onset one-sided weakness or numbness or tingling or droopy face or slurring of speech.  She has 1 son who lives in [place], her other daughter lives in the [place] area and her youngest daughter lives about an hour away.  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  I reviewed your office note from 08/22/2023.  She had blood work through your office on 08/22/2023 and I reviewed test results in her paper chart.  Lipid panel showed benign findings with total cholesterol of 179, triglycerides 120, LDL 85, CMP benign with BUN 14, creatinine 0.79, sodium 140, potassium 4.4, AST 18, ALT 11.  TSH was below normal at 0.28, folate normal at 10.4.  She also had a vitamin D and vitamin B12 check, but those 2 results are not available for my review today.  She had a brain MRI without contrast on 09/02/2023 and I reviewed the results:  IMPRESSION:   Mild cerebral volume loss with mild chronic microvascular ischemic changes.   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  Patient's daughter supplements the history and reports that patient has not been sleeping well at night because of sister's issues and patient's sister keeping her awake at night.  She denies any significant snoring, she has never had a sleep study.   Her Past Medical History Is Significant For: Past Medical History:  Diagnosis Date   Anemia    Complication of anesthesia    , 1987 difficult to wake up   Eczema  Herpes    Migraine    Seasonal allergies     Her Past Surgical History Is Significant For: Past Surgical History:  Procedure Laterality Date   CESAREAN SECTION     x 3   THYROID  LOBECTOMY Left 05/24/2022   Procedure: LEFT THYROID  LOBECTOMY;  Surgeon: Kinsinger, Herlene Righter, MD;  Location: [facility];  Service: General;  Laterality: Left;   TONSILLECTOMY      Her Family History Is Significant For: History reviewed. No pertinent family history.  Her Social History Is Significant For: Social History   Socioeconomic History   Marital status: Widowed     Spouse name: Not on file   Number of children: Not on file   Years of education: Not on file   Highest education level: Not on file  Occupational History   Not on file  Tobacco Use   Smoking status: Never   Smokeless tobacco: Not on file  Vaping Use   Vaping status: Never Used  Substance and Sexual Activity   Alcohol use: Not Currently   Drug use: Never   Sexual activity: Not on file  Other Topics Concern   Not on file  Social History Narrative   Not on file   Social Drivers of Health   Financial Resource Strain: Not on file  Food Insecurity: No Food Insecurity (05/24/2022)   Hunger Vital Sign    Worried About Running Out of Food in the Last Year: Never true    Ran Out of Food in the Last Year: Never true  Transportation Needs: No Transportation Needs (05/24/2022)   PRAPARE - Administrator, Civil Service (Medical): No    Lack of Transportation (Non-Medical): No  Physical Activity: Not on file  Stress: Not on file  Social Connections: Not on file    Her Allergies Are:  Allergies  Allergen Reactions   Codeine Other (See Comments)    Unknown   Doxycycline Other (See Comments)    Unknown   Guaifenesin-Codeine Other (See Comments)    Cough syrup   Hydrocodone-Guaifenesin     Cough syrup   Lipitor [Atorvastatin] Other (See Comments)    Unknown   Lovaza [Omega-3-Acid Ethyl Esters (Fish)]    Naproxen Other (See Comments)    Unknown   Sulfonamide Derivatives Other (See Comments)    Unknown  :   Her Current Medications Are:  Outpatient Encounter Medications as of 12/29/2023  Medication Sig   levothyroxine  (SYNTHROID ) 75 MCG tablet Take 75 mcg by mouth every morning.   rosuvastatin (CRESTOR) 10 MG tablet Take 10 mg by mouth daily.   calcium  carbonate (OS-CAL - DOSED IN MG OF ELEMENTAL CALCIUM ) 1250 (500 Ca) MG tablet Take 2 tablets (2,500 mg total) by mouth daily with breakfast.   cephALEXin (KEFLEX) 500 MG capsule Take 500 mg by mouth 3 (three) times daily.    ciclopirox  (PENLAC ) 8 % solution Apply topically at bedtime. Apply over nail and surrounding skin. Apply daily over previous coat. After seven (7) days, may remove with alcohol and continue cycle.   levothyroxine  (SYNTHROID ) 50 MCG tablet Take 50 mcg by mouth every morning. (Patient not taking: Reported on 12/29/2023)   loratadine -pseudoephedrine (CLARITIN -D 24-HOUR) 10-240 MG 24 hr tablet Take 1 tablet by mouth at bedtime.   traMADol  (ULTRAM ) 50 MG tablet Take 1 tablet (50 mg total) by mouth every 6 (six) hours as needed (mild pain).   No facility-administered encounter medications on file as of 12/29/2023.  :   Review of  Systems:  Out of a complete 14 point review of systems, all are reviewed and negative with the exception of these symptoms as listed below:  Review of Systems  Neurological:        Rm 5 with daughter Powell  Pt is well, reports about 2 yrs ago she started having memory concerns. She has been repetitive with questions and retaining information recently told to her.    Mom did have some memory issues but unsure if diagnosed.        Objective:   Physical Exam   Physical Examination:   Vitals:   12/29/23 1014  BP: 134/89  Pulse: 79    General Examination: The patient is a very pleasant 63 y.o. female in no acute distress. She appears well-developed and well-nourished and well groomed.   HEENT: Normocephalic, atraumatic, pupils are equal, round and reactive to light, extraocular tracking is good without limitation to gaze excursion or nystagmus noted. Hearing is grossly intact. Face is symmetric with normal facial animation and normal facial sensation to light touch, temperature and vibration sense. Speech is clear with no dysarthria noted. There is no hypophonia. There is no lip, neck/head, jaw or voice tremor. Neck is supple with full range of passive and active motion. There are no carotid bruits on auscultation. Oropharynx exam reveals: mild mouth dryness,  adequate dental hygiene and mild airway crowding, due to small airway entry.  Tongue protrudes centrally and palate elevates symmetrically.  Chest: Clear to auscultation without wheezing, rhonchi or crackles noted.  Heart: S1+S2+0, regular and normal without murmurs, rubs or gallops noted.   Abdomen: Soft, non-tender and non-distended.  Extremities: There is no pitting edema in the distal lower extremities bilaterally.   Skin: Warm and dry without trophic changes noted.   Musculoskeletal: exam reveals no obvious joint deformities.   Neurologically:  Mental status: The patient is awake, alert and pays good attention, she is able to provide some history but details are completed provided by her daughter.  Patient may be underreporting her memory issues.       12/29/2023   10:17 AM  Montreal Cognitive Assessment   Visuospatial/ Executive (0/5) 2  Naming (0/3) 3  Attention: Read list of digits (0/2) 2  Attention: Read list of letters (0/1) 1  Attention: Serial 7 subtraction starting at 100 (0/3) 1  Language: Repeat phrase (0/2) 2  Language : Fluency (0/1) 1  Abstraction (0/2) 2  Delayed Recall (0/5) 0  Orientation (0/6) 4  Total 18   On 12/29/2023: CDT: 2/4.  Thought process is linear. Mood is normal and affect is normal.  Cranial nerves II - XII are as described above under HEENT exam.  Motor exam: Normal bulk, strength and tone is noted. There is no obvious action or resting tremor.  Fine motor skills and coordination: grossly intact.  Cerebellar testing: No dysmetria or intention tremor. There is no truncal or gait ataxia.  Normal finger-to-nose. Reflexes 1+ throughout. Sensory exam: intact to light touch in the upper and lower extremities.  Gait, station and balance: She stands easily. No veering to one side is noted. No leaning to one side is noted. Posture is age-appropriate and stance is narrow based. Gait shows normal stride length and normal pace. No problems turning are  noted.  Romberg negative.  Tandem walk slightly challenging.      Assessment & Plan:  In summary, Altamese R Sedor is a very pleasant 63 y.o.-year old female with an underlying medical history of eczema,  allergies, anemia, migraine headaches, hyperlipidemia, hypothyroidism, osteopenia, orthostatic hypotension, chest pain, and depression, who presents for evaluation of her memory loss of about 2 years duration with short-term memory issues reported, no obvious behavioral changes reported, family history of Alzheimer's dementia reported.  She does have a risk for Alzheimer's dementia but recent brain MRI also indicate some vascular risk.  She had appropriate blood work recently through your office and we will proceed with some additional blood work to look for Alzheimer's markers.  I do not think she will be a good candidate for one of the newer IV medications given the evidence of older microhemorrhages.  We talked about stroke risk factors as well today.  She would like to avoid a sleep study at this time but we can certainly consider it down the road.  I had a long discussion with the patient and her daughter today.  We talked about the importance of lifestyle modification and further workup.  Below is a summary of my recommendations and are talking points based on extended chart review, evaluation with memory testing and history and physical exam.  Memory's test scores with her MoCA examination today suggest moderately abnormal findings.    <<  Blood work (which we will do today): to look for genetic and metabolic markers for Alzheimers disease.  We will request  a formal cognitive test called neuropsychological evaluation which is done by a licensed neuropsychologist. We will make a referral in that regard.  We may consider medication for memory loss in the future.   We may consider a sleep study in the future to rule out obstructive sleep apnea.  Talk to your PCP about your thyroid  medication  dosing and your vitamin B12 and vitamin D results.  Talk to your PCP about stress management.  We will keep you posted as to your test results by phone call for now.  We will plan a follow-up after testing.   >>  This was an extended visit of over 60 minutes with record review involved, considerable counseling and coordination of care, MoCA testing and interpretation.   Thank you very much for allowing me to participate in the care of this nice patient. If I can be of any further assistance to you please do not hesitate to call me at [phone].  Sincerely,   True Mar, MD, PhD

## 2023-12-29 NOTE — Patient Instructions (Addendum)
 It was nice to meet you both today.  You have complaints of memory loss: memory loss or changes in cognitive function can have many reasons and does not always mean you have dementia.  There are several conditions and situations that can contribute to subjective or objective memory loss.  These factors include: depression, stress, sleep deprivation or poor sleep from insomnia or sleep apnea, dehydration, fluctuation in blood sugar values, thyroid  or electrolyte dysfunction, medication effects from sedating medications or narcotic pain medication for example and certain vitamin deficiencies such as vitamin B12 deficiency, and anemia. Dementia can be caused by stroke, brain atherosclerosis or brain vascular disease due to vascular risk factors (smoking, high blood pressure, high cholesterol, obesity and uncontrolled diabetes), certain degenerative brain disorders (including Parkinson's disease and Multiple sclerosis) and by Alzheimer's disease or other, more rare and sometimes hereditary causes.   Here is what I would recommend:   Blood work (which we will do today): to look for genetic and metabolic markers for Alzheimers disease.  We will request  a formal cognitive test called neuropsychological evaluation which is done by a licensed neuropsychologist. We will make a referral in that regard.  We may consider medication for memory loss in the future.   We may consider a sleep study in the future to rule out obstructive sleep apnea.  Talk to your PCP about your thyroid  medication dosing and your vitamin B12 and vitamin D results.  Talk to your PCP about stress management.  We will keep you posted as to your test results by phone call for now.  We will plan a follow-up after testing.

## 2024-01-03 ENCOUNTER — Encounter: Payer: Self-pay | Admitting: Psychology

## 2024-01-13 LAB — ATN PROFILE
A -- Beta-amyloid 42/40 Ratio: 0.098 — ABNORMAL LOW (ref >0.102)
Beta-amyloid 40: 185.68 pg/mL
Beta-amyloid 42: 18.24 pg/mL
N -- NfL, Plasma: 3.61 pg/mL (ref 0.00–3.65)
T -- p-tau181: 2.06 pg/mL — ABNORMAL HIGH (ref 0.00–0.97)

## 2024-01-13 LAB — APOE ALZHEIMER'S RISK

## 2024-01-18 ENCOUNTER — Ambulatory Visit: Payer: Self-pay | Admitting: Neurology

## 2024-01-18 NOTE — Telephone Encounter (Signed)
 I called patient. I spoke with patient's daughter, Powell, per DPR. She has already reviewed these lab results. Neuropsych appt is 02/28/24. She asked that a follow up be made for 03/27/24 with Dr. Buck. Appt scheduled. Patient's daughter verbalized understanding.

## 2024-01-18 NOTE — Telephone Encounter (Signed)
-----   Message from True Mar sent at 01/18/2024 12:45 PM EDT ----- Please advise patient or family member on DPR that her Alzheimer's markers in the blood do indicate increased risk for Alzheimer's dementia in her case.  For now, we will proceed with evaluation  through neuropsychology as planned for next month and we will plan to follow-up afterwards. ----- Message ----- From: Rebecka Memos Lab Results In Sent: 12/30/2023   7:37 AM EDT To: True Mar, MD

## 2024-02-28 ENCOUNTER — Encounter: Payer: Self-pay | Admitting: Psychology

## 2024-02-28 ENCOUNTER — Encounter: Attending: Psychology | Admitting: Psychology

## 2024-02-28 DIAGNOSIS — R413 Other amnesia: Secondary | ICD-10-CM | POA: Insufficient documentation

## 2024-02-28 NOTE — Progress Notes (Signed)
 NEUROPSYCHOLOGICAL EVALUATION [place]. [facility]  Physical Medicine and Rehabilitation     Patient: Joanne Smith  MRN: [id number] DOB: [birth year]  Age: 63 y.o. Sex: female  Race/Ethnicity: White or Caucasian  Years of Education: 14  Collateral Information Source: Daughter-in-law [name]  Referring Provider: Buck Saucer, MD  Provider/Clinical Neuropsychologist: Evalene DOROTHA Riff, PsyD  Date of Service: 02/28/24 Start Time: 3 PM End Time: 5 PM  Location of Service:  [facility] Physical Medicine & Rehabilitation Department [place]. [facility] [address], [place]. [address] [place], [place] [address] Phone: [phone]  Billing Code/Service:            96116/96121  Individuals Present: Patient was seen, accompanied by her daughter-in-law, in-person, by the provider. 1 hour and 15 minutes spent in face-to-face clinical interview and remaining 45 minutes was spent in record review, documentation, and testing protocol construction.    PATIENT CONSENT AND CONFIDENTIALITY The patient's understanding of the reason for referral was intact. Discussed limits of confidentiality including, but not limited to, posting of final evaluation report in the patient's electronic medical record for both the patient and for the referring provider and appropriate medical professionals. Patient was given the opportunity to have their questions answered. The neuropsychological evaluation process was discussed with the patient and they consented to proceed with the evaluation.  Consent for Evaluation and Treatment: Signed: Yes Explanation of Privacy Policies: Signed: Yes Discussion of Confidentiality Limits: Yes  REASON FOR REFERRAL:The patient was is a 63 year old woman with history of eczema, allergies, anemia, migraine headaches, hyperlipidemia, hypothyroidism, osteopenia, orthostatic hypotension, chest pain, and depression, who was referred for neuropsychological evaluation  by her Neurologist, Dr. Buck, due to concerns for memory loss. Records from the referring provider from a visit on 12/29/23 state difficulties with short-term memory loss are suspected to have been present for around two years. The patient has a suspected family history of Alzheimer's dementia. Lab work (APOE4 and ATN) reportedly shows increased risk for Alzheimer's dementia and MRI of the brain shows some concerns for cerebrovascular disease. Scores on a cognitive screener were below normal limits (MoCA 18/30 on 12/29/2023).   The patient was accompanied to the interview for the current evaluation by her Daughter-in-law [name] with her permission. [name] provided collateral report based on her observations and information shared by the patient's children. The patient expressed worry about cognitive health, having seen dementia in her mother. [name] indicated family is hoping to gain a better understanding of the patient's cognitive changes and current status.   HISTORY OF PRESENTING CONCERNS:  Cognitive Symptom Onset & Course: The patient indicated she has noticed limited cognitive changes and herself.  Family has noticed more significant changes in the last few years.  Current Cognitive Complaints:  Memory: Patient denied noticing significant changes in her short term memory. Family has noticed increased forgetfulness, difficulties with remembering recent conversations, order of recent events, and unintentionally repeating of information.  Processing Speed: Patient denied noticing significant changes in processing speed. No changes noted by family.   Attention & Concentration: Patient denied noticing significant changes in attention/concentration. No changes noted by family. Language: Patient denied noticing significant changes in language. No changes noted by family.    Visual-Spatial: Patient denied noticing significant changes in visual-spatial skills. No marked changes, but the patient may be more  cautious when driving.     Executive Functioning: Patient denied noticing significant changes in areas related to executive functioning (organization, problem solving, personality). No changes noted in organization  or personality per family.      Motor/Sensory Complaints:  Sensory changes: No significant changes or problems with sense of smell, taste, hearing, or vision. Balance/coordination difficulties: No difficulties with balance and coordination or frequent falls. Frequent instances of dizziness/vertigo: No problems with frequent lightheadedness or vertigo. Other motor difficulties: No indications of tremor or significant changes in handwriting.  Emotional and Behavioral Functioning: The patient has had a number of notable psychosocial stressors as she has been providing care taking of her sisters in recent years. One of her sisters passed away in 14-Jan-2024. Her other sister has required a significant amount of support, and was recently hospitalized for the second time due to physical health difficulties. There were some possible indications of delirium in her sister that resolved early in one stay which was understandably distressing to the patient. [place] described the patient struggling significantly during this time. The patient described spending most of her time with care giving in recent years. 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  Depression: The patient denied severe mood related difficulties, but acknowledged that she has had less time to spend doing enjoyable activities due to care taking and other responsibilities. She denied problems with anhedonia, suicidal ideation, or other clear indications of depression.  Anxiety: The patient denied any problems with excessive/difficult to control worry or other problems with generalized anxiety.  Other: The patient has experienced traumatic events in her life,  including the loss of her spouse. She denied any clear indications of PTSD related symptoms. She denied any current or past experiences of hallucination, paranoia, symptoms of mania, homicidal ideation, or inpatient psychiatric admissions. She has no prior treatment history.   Sleep: Sleeps pretty well overall. Denied significant problems with sleep onset or maintenance. She indicated she feels variably rested in the morning.  Appetite: Some unintentional weight loss.  Caffeine: 1 cup of coffee per day.   Alcohol Use: Little to none.  Tobacco Use: None Recreational Substance Use: None   Level of Functional Independence: The patient is independent with basic activities of daily living. The patient is able to go shopping, prepare meals and do household chores independently and without issue. Per family, there have been some difficulties with managing the complex finances involved in the family business and properties and family has stepped in to provide support. Family has also been increasingly involved in assisting with medical appointments and follow-up due to concerns for memory. The patient ustilizes some reminders for medication management. The patient continues to drive and has no difficulties with become lost in familiar places, but is reportedly more cautious per family.   Medical History/Record Review: Per records and patient report, History of traumatic brain injury/concussion: None    History of stroke: Discovered via imaging,   History of heart attack: None   History of cancer/chemotherapy:None   History of seizure activity: None   Symptoms of chronic pain: None   Experience of frequent headaches/migraines: No problems recently.     Imaging/Lab Results: Per records,   12/29/2023 A+ T+ N-  A low beta-amyloid 42/40 and a high [id number] concentration were  observed. A normal NfL concentration was observed at this time.  These results are consistent with the presence of Alzheimer's   related pathology. Plasma findings may be less precise than  CSF or PET. Additional assessments may be necessary. These tests  are intended to be used in the context of clinical care. 12/29/2023 APO  E Genotyping Result: Comment Comment: E4/E4 (two copies of the APOE4 variant) Positive for two copies of the APOE4 variant that is  associated with increased risk of late onset Alzheimer's  disease (AD). Therefore the lifetime risk for AD is  increased in this individual. However, many individuals  with the E4/E4 genotype do not develop AD.  09/02/2023 EXAMINATION: MR BRAIN WO CONTRAST  FINDINGS: No evidence for intracranial mass, hemorrhage, or acute infarct. There is mild diffuse cerebral volume loss. There are mild bilateral periventricular and subcortical white matter T2 hyperintensities, which are nonspecific, but most likely secondary to chronic microvascular ischemia. The ventricles appear symmetric and the basilar cisterns are patent. A lacunar infarct noted in the left thalamus. The major intracranial flow voids appear preserved. The paranasal sinuses and mastoid air cells are well aerated. The orbits appear normal. Old microhemorrhages are noted in the right parietal lobe and left frontal lobe.   IMPRESSION: Mild cerebral volume loss with mild chronic microvascular ischemic changes. 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  Past Medical History:  Diagnosis Date   Anemia    Complication of anesthesia    , 1987 difficult to wake up   Eczema    Herpes    Migraine    Seasonal allergies    Patient Active Problem List   Diagnosis Date Noted   Thyroid  mass 05/24/2022    Family Neurologic/Medical Hx: Family history of suspected Alzheimer's dementia in mother.   Medications:  calcium  carbonate (OS-CAL - DOSED IN MG OF ELEMENTAL CALCIUM ) 1250 (500 Ca) MG tablet cephALEXin (KEFLEX) 500 MG capsule ciclopirox  (PENLAC ) 8 % solution levothyroxine  (SYNTHROID ) 50 MCG  tablet levothyroxine  (SYNTHROID ) 75 MCG tablet loratadine -pseudoephedrine (CLARITIN -D 24-HOUR) 10-240 MG 24 hr tablet rosuvastatin (CRESTOR) 10 MG tablet  Academic/Vocational History: Performed well in school growing up. No indications of ADHD reported. No formal learning difficulties per patient descriptions. 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 Some responsibilities shifted over to her adult children.   Psychosocial: Marital Status: Widowed  Children/Grandchildren: 3 adult children and grandchildren.  Living Situation: Lives alone while her sister is currently in assisted living recovering from health difficulties.  Daily Activities/Hobbies: Limited at present.   Mental Status/Behavioral Observations: The patient was seen on an outpatient basis in the [facility] PM&R office for the clinical interview accompanied by her daughter-in-law with her permission.  Sensorium/Arousal: Alert.  Hearing and vision are adequate for the purpose of the evaluation. Orientation: Oriented to the purpose of the visit. Appearance: Appropriate dress and hygiene. Behavior: Cooperative. Speech/Language: Conversational speech was prosodic, fluent, and well-articulated.  No paraphasic errors or word finding difficulties were noted. Motor: Ambulated independently without issue.  No tremor was noted. Social Comportment: Appropriate for the setting. Mood: Anxious. Affect: Congruent.   Thought Process/Content: Coherent, linear, and goal directed. Ability to Participate in Interview: Readily answered all questions posed during the clinical interview. Some difficulties with recent recall. Some instances of repeating information seemingly without intent.  Insight: Limited  SUMMARY / CLINICAL IMPRESSIONS The patient was is a 63 year old woman with history of eczema, allergies, anemia, migraine headaches, hyperlipidemia, hypothyroidism, osteopenia, orthostatic hypotension, chest pain, and depression, who was referred for  neuropsychological evaluation by her Neurologist, Dr. Buck, due to concerns for memory loss. Records from the referring provider from a visit on 12/29/23 state difficulties with short-term memory loss are suspected to have been present for around two years. The patient has a suspected family history of Alzheimer's dementia. Lab work (APOE4 and ATN) reportedly shows increased risk  for Alzheimer's dementia and MRI of the brain shows some concerns for cerebrovascular disease. Scores on a cognitive screener were below normal limits (MoCA 18/30 on 12/29/2023). The patient was accompanied to the interview for the current evaluation by her Daughter-in-law [name] with her permission. [name] provided collateral report based on her observations and information shared by the patient's children. The patient expressed worry about cognitive health, having seen dementia in her mother. [name] indicated family is hoping to gain a better understanding of the patient's cognitive changes and current status.   The patient reported minimal cognitive changes herself, although family is reportedly observing more significant declines in recent years involving primarily short term memory. The patient is intact with basic activities of daily living. Some instrumental activities (medical appointments, managing businesses) are now supported by family. 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 She cites these responsibilities as taking up a large portion of her concentration and time. She denies clinically significant levels of depression related symptoms but endorsed some feelings of low mood on occasion. Given concerns for cognitive decline and medical test findings, a formal neuropsychological evaluation is indicated due to concerns for possible declines related to Alzheimer's disease and/or cerebrovascular disease.   DISPOSITION / PLAN The patient has been  set up for a formal neuropsychological assessment to objectively assess her cognitive functioning across domains to establish the patient's cognitive profile. This data, in conjunction with information obtained via clinical interview and medical record review, will help clarify likely etiology and guide treatment recommendations. Once data collection and interpretation have been completed, the findings / diagnosis and recommendations will be reviewed and discussed with the patient during a feedback appointment with the neuropsychologist. Based on the collaborative dialogue with the patient during the feedback, recommendations may be adjusted / tailored as needed. A formal report will be produced and provided to the patient and the referring provider.   Diagnosis: Memory Loss  Full report to follow.    Evalene DOROTHA Riff, PsyD Clinical Neuropsychology [address] [place], [place] [address] Main: [phone] Fax: [phone]  This report was generated using voice recognition software. While this document has been carefully reviewed, transcription errors may be present. I apologize in advance for any inconvenience. Please contact me if further clarification is needed.

## 2024-03-01 ENCOUNTER — Encounter (HOSPITAL_BASED_OUTPATIENT_CLINIC_OR_DEPARTMENT_OTHER)

## 2024-03-01 DIAGNOSIS — R413 Other amnesia: Secondary | ICD-10-CM

## 2024-03-01 NOTE — Progress Notes (Signed)
 Mental Status/Behavioral Observations (03/01/2024):  Orientation: The patient was not fully oriented. She was oriented to self but not to place (unable to name medical organization) or time (unable to name day of the week).  Sensory/Arousal: Hearing and vision were adequate for testing. The patient was alert. Appearance: Dress and hygiene were appropriate for the setting.  Speech/Language: In conversation, the patient's speech was prosodic, fluent, and well-articulated. The patient displayed no indications of word finding difficulties and no word substitution errors were observed.  Motor: The patient ambulated independently and without issue. No tremors were observed.  Social Comportment: Social behavior was appropriate to the setting. Mood/Affect: Mood was largely anxious. Affect was consistent with mood.  Attention/Concentration: The patient appeared to maintain consistent engagement throughout testing. No frank attentional lapses were observed.   Thought Process/Content: The patient's thought process was coherent, linear, goal directed. There were no indications of psychosis.  Additional Observations: The patient showed no difficulties with understanding task instructions. She demonstrated mild difficulties with following testing instructions at times. She had moderate difficulties with frustration tolerance and became slightly tearful during memory tasks. The patient occasionally made statements criticizing her performance on testing. Neuropsychology Note Joanne Smith completed 130 minutes of neuropsychological testing with technician, Josue Ned, BA, under the supervision of Evalene Riff, PsyD., Clinical Neuropsychologist. The patient did not appear overtly distressed by the testing session, per behavioral observation or via self-report to the technician. Rest breaks were offered.   Clinical Decision Making: In considering the patient's current level of functioning, level of presumed  impairment, nature of symptoms, emotional and behavioral responses during clinical interview, level of literacy, and observed level of motivation/effort, a battery of tests was selected by Dr. Riff during initial consultation on 02/28/2024. This was communicated to the technician. Communication between the neuropsychologist and technician was ongoing throughout the testing session and changes were made as deemed necessary based on patient performance on testing, technician observations and additional pertinent factors such as those listed above.  Tests Administered: Automatic Data Edition (BNT-2) Brief Visuospatial Memory Test-Revised (BVMT-R) Controlled Oral Word Association Test (FAS & Animals) Delis-Kaplan Executive Function System (D-KEFS), select subtests Hopkins Verbal Learning Test - Revised (HVLT-R) Repeatable Battery for the Assessment of Neuropsychological Status Update (RBANS) Trail Making Test (TMT; Part A & B) Wechsler Adult Intelligence Scale-Fourth Edition (WAIS-IV), select subtests Wechsler Memory Scale-Fourth Edition (WMS-IV) , select subtests Wechsler Memory Scale-Third Edition (WMS-III), select subtests  Wechsler Test of Adult Reading (WTAR) Geriatric Depression Scale-Short Form (GDS-SF) Geriatric Anxiety Inventory (GAI)  Results: Note: This summary of test scores accompanies the interpretive report and should not be interpreted by unqualified individuals or in isolation without reference to the report. Test scores are relative to age, gender, and educational history as available and appropriate. Measurement properties of test scores: IQ, Index, and Standard Scores (SS): Mean = 100; Standard Deviation = 15; Scaled Scores (ss): Mean = 10; Standard Deviation = 3; Z scores (Z): Mean = 0; Standard Deviation = 1; T scores (T); Mean = 50; Standard Deviation = 10  Intellectual/Premorbid Functioning Estimate   Norm Score Percentile  Range  Wechsler Test of Adult Reading  SS  = 120 91 %ile Above Average   ATTENTION AND WORKING MEMORY    Norm Score Percentile  Range  WAIS-IV          Digit Span  ss = 8 25 %ile Average   DSF  ss = 10 50 %ile Average   Span:  7      DSB  ss = 10 50 %ile Average   Span:    4      DSS  ss = 5 5 %ile Below Average   Span:    4     WMS-III          Spatial Span  ss = 7 16 %ile Low Average   SSF  ss = 9 37 %ile Average   Span:    5      SSB  ss = 8 25 %ile Average   Span:    4      PROCESSING SPEED    Norm Score Percentile  Range  WAIS-IV          Coding  ss = 4 2 %ile Below Average   LANGUAGE    Norm Score Percentile  Range  Boston Naming Test (BNT-2)  t = 41 18 %ile Low Average  COWAT          FAS  t = 40 16 %ile Low Average   Animals  t = 19 0.1 %ile Exceptionally Low   EXECUTIVE FUNCTIONING    Norm Score Percentile  Range  DKEFS - Color-Word Interference          Color Naming  ss = 6 9 %ile Low Average   Word Reading  ss = 5 5 %ile Below Average   Inhibition  ss = 8 25 %ile Average   Errors  ss = 12 75 %ile High Average   Inhibition Switching  ss = 1 0.1 %ile Discontinued (time limit reached)   Errors  ss = 8 25 %ile Average  Trails A  t = 27 1 %ile Exceptionally Low  Trails B  t = 11 <0.1 %ile Discontinued (time limit reached)   MEMORY    Norm Score Percentile  Range  BVMT-R          Trial 1  t = 29.0 2 %ile Exceptionally Low   Trial 2  t = <20 <0.1 %ile Exceptionally Low   Trial 3  t = <20 <0.1 %ile Exceptionally Low   Total Recall  t = <20 <0.1 %ile Exceptionally Low   Learning  t = 35.0 7 %ile Below Average   Delayed Recall  t = <20 <0.1 %ile Exceptionally Low   % Retained    0 <1 %ile Exceptionally Low   Hits     11-16 %ile Low Average   False Alarms     <1 %ile Exceptionally Low   Recognition Discriminability     3-5 %ile Below Average  HVLT          Total Recall  t = <20 <0.1 %ile Exceptionally Low   Delayed Recall  t = <20 <0.1 %ile Exceptionally Low   %Retention  t = <20 <0.1 %ile Exceptionally  Low   Recognition Discriminability  t = <=20 <0.1 %ile Exceptionally Low  Wechsler Memory Scale, 4th Edition (WMS-4)         Log. Mem. Immediate Recall  ss = 3 1 %ile Exceptionally Low   Logical Memory Delayed Recall  ss = 1 0.1 %ile Exceptionally Low   Logical Recognition    3rd-9th  %ile Low to Below Average   VISUAL-SPATIAL    Norm Score Percentile  Range  RBANS Visuospatial Index          RBANS Figure Copy  ss = 4 2 %ile Below Average   RBANS Line Orientation  17th-25th  %ile Average to Low Average   PERSONALITY AND BEHAVIORAL FUNCTIONING      Score/Interpretation  GDS-SF Raw       3  GDS-SF Severity       Minimal.  GAI Raw       7  GAI Severity       Minimal.   Feedback to Patient: Joanne Smith will return on 03/15/2024 for an interactive feedback session with Dr. Hayden at which time her test performances, clinical impressions and treatment recommendations will be reviewed in detail. The patient understands she can contact our office should she require our assistance before this time.  130 minutes spent face-to-face with patient administering standardized tests, 30 minutes spent scoring radiographer, therapeutic). [CPT [id number], [id number]]  Full report to follow.

## 2024-03-03 ENCOUNTER — Encounter: Payer: Self-pay | Admitting: Psychology

## 2024-03-04 ENCOUNTER — Encounter: Admitting: Psychology

## 2024-03-04 DIAGNOSIS — F0390 Unspecified dementia without behavioral disturbance: Secondary | ICD-10-CM | POA: Diagnosis not present

## 2024-03-04 DIAGNOSIS — R413 Other amnesia: Secondary | ICD-10-CM | POA: Diagnosis not present

## 2024-03-15 ENCOUNTER — Encounter: Admitting: Psychology

## 2024-03-15 DIAGNOSIS — F0390 Unspecified dementia without behavioral disturbance: Secondary | ICD-10-CM | POA: Diagnosis not present

## 2024-03-15 NOTE — Progress Notes (Signed)
 NEUROPSYCHOLOGICAL EVALUATION [place]  Physical Medicine and Rehabilitation     Patient: Joanne Smith  MRN: [id number] DOB: 1961-03-08  Age: 63 y.o. Sex: female  Race/Ethnicity: White or Caucasian  Years of Education: 14  Referring Provider: True Mar, MD, PhD Provider/Clinical Neuropsychologist: Evalene DOROTHA Riff, PsyD Collateral Information Source: Daughter-in-law [name]  Date of Service: 03/04/2024 Start Time: 11 AM End Time: 12 PM Location: [facility] [address], [place], [place] [address] Billing Code/Service: [phone]  Individuals Present: Evalene Riff, PsyD 1 hour was spent on interpretation of patient data, interpretation of standardized test results and clinical data, clinical decision making, initial treatment planning/recommendations, and report writing. The report will be amended as needed based on any additional information collected during interactive feedback session.   REASON FOR REFERRAL: The patient was is a 63 year old woman with history of eczema, allergies, anemia, migraine headaches, hyperlipidemia, hypothyroidism, osteopenia, orthostatic hypotension, chest pain, and depression, who was referred for neuropsychological evaluation by her Neurologist, Dr. Mar, due to concerns for memory loss. Records from the referring provider from a visit on 12/29/23 state difficulties with short-term memory loss are suspected to have been present for around two years. The patient has a suspected family history of Alzheimer's dementia. Lab work (APOE4 and ATN) reportedly shows increased risk for Alzheimer's dementia and MRI of the brain shows some concerns for cerebrovascular disease. Scores on a cognitive screener were below normal limits (MoCA 18/30 on 12/29/2023).   The patient was accompanied to the interview for the current evaluation by her Daughter-in-law [name] with her permission.  [name] provided collateral report based on her observations and information shared by the patient's children. The patient expressed worry about cognitive health, having seen dementia in her mother. [name] indicated family is hoping to gain a better understanding of the patient's cognitive changes and current status.   HISTORY OF PRESENTING CONCERNS:  Cognitive Complaints: The patient indicated she has noticed limited cognitive changes in herself.  Family has noticed more significant changes in the last few years.  Memory: Patient denied noticing significant changes in her short term memory. Family has noticed increased forgetfulness, difficulties with remembering recent conversations, order of recent events, and unintentionally repeating of information.  Processing Speed: Patient denied noticing significant changes in processing speed. No changes noted by family.   Attention & Concentration: Patient denied noticing significant changes in attention/concentration. No changes noted by family. Language: Patient denied noticing significant changes in language. No changes noted by family.    Visual-Spatial: Patient denied noticing significant changes in visual-spatial skills. No marked changes, but the patient may be more cautious when driving.     Executive Functioning: Patient denied noticing significant changes in areas related to executive functioning (organization, problem solving, personality). No changes noted in organization or personality per family.      Motor/Sensory Complaints:  Sensory changes: No significant changes or problems with sense of smell, taste, hearing, or vision. Balance/coordination difficulties: No difficulties with balance and coordination or frequent falls. Frequent instances of dizziness/vertigo: No problems with frequent lightheadedness or vertigo. Other motor difficulties: No indications of tremor or significant changes in handwriting.  Emotional and Behavioral  Functioning:  The patient has had a number of notable psychosocial stressors in her life in recent years. She has been providing care to her sisters in recent years in her home, and one of her sisters passed away in 2024/01/06. Her other sister has required a significant amount  of support, and was recently hospitalized for the second time due to physical health difficulties. There were some possible indications of delirium in her sister that resolved early in one stay which was understandably distressing to the patient. [name] described the patient struggling significantly with some cognitively demanding tasks during this time. The patient described spending most of her time in caregiving in recent years, and has had limited time for self care. 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  Depression: The patient denied severe mood related difficulties, but acknowledged that she has had less time to spend doing enjoyable activities due to care taking and other responsibilities. She denied problems with anhedonia, suicidal ideation, or other clear indications of depression.  Anxiety: The patient denied any problems with excessive/difficult to control worry or other problems with generalized anxiety.  Other: The patient has experienced traumatic events in her life, including the loss of her spouse. She denied any clear indications of PTSD related symptoms. She denied any current or past experiences of hallucination, paranoia, symptoms of mania, homicidal ideation, or inpatient psychiatric admissions. She has no prior treatment history.  Sleep: Sleeps pretty well overall. Denied significant problems with sleep onset or maintenance. She indicated she feels variably rested in the morning.  Appetite: Some unintentional weight loss.  Caffeine: 1 cup of coffee per day.   Alcohol Use: Little to none.  Tobacco Use:  None Recreational Substance Use: None   Academic/Vocational History: Performed well in school growing up. No indications of ADHD reported. No formal learning difficulties per patient descriptions. 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 Some responsibilities shifted over to her adult children as of late.   Psychosocial Marital Status: Widowed  Children/Grandchildren: 3 adult children and grandchildren.  Living Situation: Lives alone while her sister. Her sister is currently in assisted living recovering from health difficulties.  Daily Activities/Hobbies: Limited at present.   Level of Functional Independence: The patient is independent with basic activities of daily living. The patient is able to go shopping, prepare meals and do household chores independently and without issue. Per family, there have been some difficulties with managing the complex finances involved in the family business and properties and family has stepped in to provide support. Family has also been increasingly involved in assisting with medical appointments and follow-up due to concerns for memory. The patient utilizes some reminders for medication management. The patient continues to drive and has no difficulties with getting lost in familiar places, but is reportedly more cautious per family's observations.   Medical History/Record Review: Per records and patient report, History of traumatic brain injury/concussion: None    History of stroke: Discovered via imaging,   History of heart attack: None   History of cancer/chemotherapy:None   History of seizure activity: None   Symptoms of chronic pain: None   Experience of frequent headaches/migraines: No problems recently.     Imaging/Lab Results: Per records,  12/29/2023 A+ T+ N-  12/29/2023 APO E Genotyping Result: E4/E4 09/02/2023 EXAMINATION: MR BRAIN WO CONTRAST  IMPRESSION: Mild cerebral volume loss with mild chronic microvascular ischemic changes. Old lacunar infarct in the  left thalamus.  Past Medical History:  Diagnosis Date   Anemia    Complication of anesthesia    , 1987 difficult to wake up   Eczema    Herpes    Migraine    Seasonal allergies    Patient Active Problem List   Diagnosis Date  Noted   Thyroid  mass 05/24/2022   Family Neurologic/Medical Hx: Family history of suspected Alzheimer's dementia in mother.   Medications:  calcium  carbonate (OS-CAL - DOSED IN MG OF ELEMENTAL CALCIUM ) 1250 (500 Ca) MG tablet cephALEXin (KEFLEX) 500 MG capsule ciclopirox  (PENLAC ) 8 % solution levothyroxine  (SYNTHROID ) 50 MCG tablet levothyroxine  (SYNTHROID ) 75 MCG tablet loratadine -pseudoephedrine (CLARITIN -D 24-HOUR) 10-240 MG 24 hr tablet rosuvastatin (CRESTOR) 10 MG tablet  NEUROPSYCHODIAGNOSTIC FINDINGS: Mental Status/Behavioral Observations (03/01/2024):  Orientation: The patient was oriented to self. She was oriented to facility type and city but not facility/health organization name. She was not oriented to day of week but she was oriented to year, month, calendar day, and time. Sensory/Arousal: Hearing and vision were adequate for the purpose of testing. The patient was alert. Appearance: Dress and hygiene were appropriate for the setting.  Speech/Language: In conversation, the patient's speech was prosodic, fluent, and well-articulated. The patient displayed no indications of word finding difficulties and no word substitution errors were observed.  Motor: The patient ambulated independently and without issue. No tremors were observed.  Social Comportment: Social behavior was appropriate for the setting. Mood/Affect: Mood and affect were congruent. The patient was anxious / nervous during testing.  Attention/Concentration: The patient appeared to maintain consistent engagement throughout testing. No frank attentional lapses were observed.   Thought Process/Content: The patient's thought process was coherent, linear, goal directed. There were no  indications of psychosis.  Additional Observations: The patient showed no difficulties with understanding task instructions. The patient occasionally made statements criticizing her performance on testing.She became slightly tearful during memory tasks and had some difficulties with frustration. She showed no indications of impulsivity.    Tests Administered: Automatic Data Edition (BNT-2) Brief Visuospatial Memory Test-Revised (BVMT-R) Controlled Oral Word Association Test (FAS & Animals) Delis-Kaplan Executive Function System (D-KEFS), select subtests Hopkins Verbal Learning Test - Revised (HVLT-R) Repeatable Battery for the Assessment of Neuropsychological Status Update (RBANS), select subtests Trail Making Test (TMT; Part A & B) Wechsler Adult Intelligence Scale-Fourth Edition (WAIS-IV), select subtests Wechsler Memory Scale-Fourth Edition (WMS-IV) , select subtests Wechsler Memory Scale-Third Edition (WMS-III), select subtests  Wechsler Test of Adult Reading (WTAR) Geriatric Depression Scale-Short Form (GDS-SF) Geriatric Anxiety Inventory (GAI)  Results: Test scores are are relative to age and, when possible and appropriate, are demographically adjusted. Measurement properties of test scores: IQ, Index, and Standard Scores (SS): Mean = 100; Standard Deviation = 15; Scaled Scores (ss): Mean = 10; Standard Deviation = 3; Z scores (Z): Mean = 0; Standard Deviation = 1; T scores (T); Mean = 50; Standard Deviation = 10  Intellectual/Premorbid Functioning Estimate   Norm Score Percentile  Range  Wechsler Test of Adult Reading  SS = 120 91 %ile Above Average   ATTENTION AND WORKING MEMORY    Norm Score Percentile  Range  WAIS-IV          Digit Span  ss = 8 25 %ile Average   DSF  ss = 10 50 %ile Average   Span:    7      DSB  ss = 10 50 %ile Average   Span:    4      DSS  ss = 5 5 %ile Below Average   Span:    4     WMS-III          Spatial Span  ss = 7 16 %ile Low Average    SSF  ss = 9 37 %ile Average   Span:  5      SSB  ss = 8 25 %ile Average   Span:    4      PROCESSING SPEED    Norm Score Percentile  Range  WAIS-IV          Coding  ss = 4 2 %ile Below Average   LANGUAGE    Norm Score Percentile  Range  Boston Naming Test (BNT-2)  t = 41 18 %ile Low Average  COWAT          FAS  t = 40 16 %ile Low Average   Animals  t = 19 0.1 %ile Exceptionally Low   EXECUTIVE FUNCTIONING    Norm Score Percentile  Range  DKEFS - Color-Word Interference          Color Naming  ss = 6 9 %ile Low Average   Word Reading  ss = 5 5 %ile Below Average   Inhibition  ss = 8 25 %ile Average   Errors 0 ss = 12 75 %ile High Average   Inhibition Switching  ss = 1 0.1 %ile Discontinued (time limit reached)   Errors 5 ss = 8 25 %ile Average  Trails A  t = 27 1 %ile Exceptionally Low  Trails B  t = 11 <0.1 %ile (time limit reached)   MEMORY    Norm Score Percentile  Range  BVMT-R          Trial 1 1 t = 29.0 2 %ile Exceptionally Low   Trial 2 1 t = <20 <0.1 %ile Exceptionally Low   Trial 3 2 t = <20 <0.1 %ile Exceptionally Low   Total Recall 4 t = <20 <0.1 %ile Exceptionally Low   Learning 1 t = 35.0 7 %ile Below Average   Delayed Recall 0 t = <20 <0.1 %ile Exceptionally Low   % Retained    0% <1 %ile Exceptionally Low   Hits 5    11-16 %ile Low Average   False Alarms 2    <1 %ile Exceptionally Low   Recognition Discriminability 3    3-5 %ile Below Average  HVLT          Total Recall 2,1,3 t = <20 <0.1 %ile Exceptionally Low   Delayed Recall 0 t = <20 <0.1 %ile Exceptionally Low   %Retention 0% t = <20 <0.1 %ile Exceptionally Low   Recognition Discriminability TP10-FP11=-1 t = <=20 <0.1 %ile Exceptionally Low  WMS-4         Log. Mem. Immediate Recall 11 ss = 3 1 %ile Exceptionally Low   Logical Memory Delayed Recall 0 ss = 1 0.1 %ile Exceptionally Low   Logical Recognition 19   3rd-9th  %ile Low to Below Average   VISUAL-SPATIAL    Norm Score Percentile  Range   RBANS Visuospatial Index          RBANS Figure Copy  ss = 4 2 %ile Below Average   RBANS Line Orientation      17th-25th  %ile Average to Low Average   PERSONALITY AND BEHAVIORAL FUNCTIONING      Score/Interpretation  GDS-SF Raw       3  GDS-SF Severity       Minimal.  GAI Raw       7  GAI Severity       Minimal.   SUMMARY / CLINICAL IMPRESSIONS The patient was is a 63 year old woman with history of eczema, allergies, anemia, migraine headaches, hyperlipidemia, hypothyroidism, osteopenia,  orthostatic hypotension, chest pain, and depression, who was referred for neuropsychological evaluation by her Neurologist, Dr. Buck, due to concerns for memory loss. Records from the referring provider from a visit on 12/29/23 state difficulties with short-term memory loss are suspected to have been present for around two years. The patient has a suspected family history of Alzheimer's dementia. Lab work reportedly shows increased risk for Alzheimer's pathology and MRI of the brain shows some concerns for cerebrovascular disease. Scores on a cognitive screener were below normal limits (MoCA 18/30 on 12/29/2023). The patient was accompanied to the interview for the current evaluation by her Daughter-in-law [name] with her permission. [name] provided collateral report based on her observations and information shared by the patient's children. The patient expressed worry about cognitive health, having seen dementia in her mother. [name] indicated family is hoping to gain a better understanding of the patient's cognitive changes and current status.   Upon interview, the patient reported experiencing mild cognitive changes.  Family is reportedly observing more significant declines in recent years involving primarily short term memory. The patient is intact with basic activities of daily living. Some instrumental activities (medical appointments, managing businesses) are now supported by family. 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 She cites these responsibilities as taking up a large portion of her concentration and time. She denies clinically significant levels of depression related symptoms but endorsed some feelings of low mood on occasion. Given concerns for cognitive decline and medical test findings, a formal neuropsychological evaluation is indicated due to concerns for possible declines related to Alzheimer's disease and/or cerebrovascular disease.   The patient's cognitive test data shows indications of suspected decline with performances falling below expectations by age and premorbid estimates. Deficits were most prominent and consistent within the domain of memory. Her memory profile was consistent across memory tests (one visual, two auditory-verbal) and showed reduced learning of new information and rapid information loss over time. Performances on measures of language show mild declines in word-finding and phonemic verbal fluency, but more significant difficulty with semantic verbal fluency. The patient's processing speed was scored in the below average range. Within the domain of executive functioning, had no difficulty on a basic response-inhibition task. Speeded sequencing speed was below expectations, but likely impacted by slowed processing speed to some degree. Greater than typical declines were seen when set-shifting was added to response-inhibition and sequencing tasks. Visual-spatial tasks showed moderate impairment. Scores on measures of attention were slightly weak with visual-attention and variable with auditory-verbal attention (one of two working-memory tests were below expectations). Self-report endorsements on measures of depression and anxiety generated total scores within normal limits in both areas.   A formal cognitive diagnosis is warranted given indications of significant decline and impairment in  cognitive functioning and subsequent declines in independent functioning. Descriptions of independent functioning note difficulties primarily in more complex instrumental activities of daily living. 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 There may have been contributions from anxiety that negatively impacted performance during testing, but the overall consistency in the data and information obtained via interview is concerning for a neurocognitive disorder. In terms of likely etiology, the patient's cognitive profile is concerning for some alignment with and Alzheimer's dementia, but with possible contributions from cerebrovascular disease as well. Specifically, prominent memory deficits that have an amnestic quality (  I.e., rapid information loss over time) and pattern of decline in language functioning (declines in word-finding, declines in verbal fluency most pronounced in semantic verbal fluency) align with areas of difficulty seen in AD. Difficulties in processing speed could be accounted for by cardiovascular disease. Subsequently, there is suspicion of mixed etiology (AD and vascular), although data is strongest for AD.  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 Buck as planned.  Continued engagement with medical health providers for ongoing management of chronic medical conditions is recommended. I defer to prescribing physicians regarding medication recommendations.    Collaborative assistance with activities of daily living, particularly in cases where errors can impact safety (I.e., medication), is recommended. Collaborative systems with loved ones for activities of daily living where safety issues can result from errors is helpful for  both maintaining independence but also managing issues related to cognitive changes. Collaborative system example for medications; Patient fills in weekly pill organizer and loved one checks it over for any mistakes, and patient takes medication independently. If errors begin to appear in the weekly check, the loved one can take over the weekly set-up, while the patient can continue taking medications on their own.  Engaging in cognitively stimulating activities is beneficial for cognitive health and reducing risk of increased cognitive decline. Cognitively stimulating activities are at their core activities which require your active participation, concentration, and cognitive engagement. Cognitive stimulation can take many forms. Reading, puzzles, and even some activities of daily living can apply. It doesn't need to be formal: Social interaction is actually a exceptionally stimulating activity from a cognitive perspective. Activities can be, and ideally are, things you enjoy. Find what works for you!  Regular exercise / physical activity (with physician approval/monitoring) has been found to be beneficial to cognitive health. It can also benefit mood and anxiety as well! The MIND diet, a modified mediterranean diet, has been found to be beneficial for reducing risk of cognitive decline and may be beneficial for promoting brain health.  There is significant risk future cognitive and functional decline, such that greater levels of support may be needed to maintain optimal quality of life and safety. Planning for current and future needs is therefore encouraged. Doing so now will ensure the patient is able to voice her wishes regarding her future care.  Cognitive testing does not directly measure driving ability, but some areas of difficulty seen on testing (processing speed, executive functioning) correlate with driving safety. If the patient or family have concerns of driving safety and she wishes to continue  to drive, a formal driving evaluation is recommended. Below are some places in the community that this can be done include; -[facility] in [place]: [phone], -[facility]: [phone], [facility]: [phone], [facility]: [phone] or [phone] The Alzheimer's Association has two chapter in [place]  and provides answers to many questions for patients and families on their website for any diagnosis of Major neurocognitive Disorder/dementia. There is a good deal of helpful information and resources that many people find beneficial in planning and navigating issues that may arise. https://www.williams-garcia.biz/.  Please reach out to me any questions or concerns emerge upon receipt of this report.  Evalene DOROTHA Riff, PsyD Clinical Neuropsychology [address] [place], [place] [address] Main: [phone] Fax: [phone]  This report was generated using voice recognition software. While this document has been carefully reviewed, transcription errors may be present. I apologize  in advance for any inconvenience. Please contact me if further clarification is needed.

## 2024-03-27 ENCOUNTER — Encounter: Payer: Self-pay | Admitting: Neurology

## 2024-03-27 ENCOUNTER — Ambulatory Visit (INDEPENDENT_AMBULATORY_CARE_PROVIDER_SITE_OTHER): Admitting: Neurology

## 2024-03-27 VITALS — BP 118/66 | HR 55 | Ht 64.0 in | Wt 140.0 lb

## 2024-03-27 DIAGNOSIS — F03A0 Unspecified dementia, mild, without behavioral disturbance, psychotic disturbance, mood disturbance, and anxiety: Secondary | ICD-10-CM | POA: Diagnosis not present

## 2024-03-27 MED ORDER — DONEPEZIL HCL 5 MG PO TABS: 5.0000 mg | ORAL_TABLET | Freq: Every day | ORAL | 5 refills | Status: AC

## 2024-03-27 NOTE — Progress Notes (Signed)
 Subjective:    Patient ID: Joanne Smith is a 63 y.o. female.  HPI    Interim history:   Joanne Smith is a 63 year old female with an underlying medical history of eczema, allergies, anemia, migraine headaches, hyperlipidemia, hypothyroidism, osteopenia, orthostatic hypotension, chest pain, and depression, who presents for follow-up consultation of her memory loss.  The patient is accompanied by her daughter-in-law today.  I first met her at the request of her primary care physician on 12/29/2023, at which time she reported an approximately 2-year history of forgetfulness and short-term memory issues.  Her MoCA score was 18 at the time.  She was advised to proceed with additional testing in the form of blood work, neuropsychological evaluation and we considered a sleep study.  She was advised to talk to her PCP about her thyroid  medication dose and her vitamin B12 and vitamin D results.  She was also encouraged to talk to PCP about stress management.  We did blood testing for Alzheimer's risk.  She had 2 copies of APO E4 variants.  This puts her at higher risk for developing Alzheimer's dementia statistically.  ATN profile also put her at higher risk for Alzheimer's related pathology.  She had neuropsychological evaluation with Dr. Evalene Riff on 02/28/2024 and I reviewed the visit note in detail.  She had neuropsychological testing on 03/01/2024 report as well as clinical impressions and recommendations.  She had a follow-up visit with Dr. Riff on 03/04/2024.  The encounter note may still be open.  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  Alzheimer's dementia was deemed possible with possible contributions from cerebrovascular disease.  Recommendations included continued assistance and independent functioning, pursuing the mind diet, engaging in cognitively stimulating exercises and getting resources from  reputable websites such as [employer].  Today, 03/27/2024 (all dictated new, as well as above notes, some dictation done in note pad or Word, outside of chart, may appear as copied):   She reports feeling more or less stable, no new issues have come up since the first appointment back in August 2025.  They had a good visit with Dr. Riff and daughter-in-law has taken diligent notes and is taking additional notes today and has good questions and additional information.  Patient is driving locally, they will be monitoring her driving.  She is currently staying by herself, her sister who is in rehab still and lived with the patient may not be able to stay with her any longer as the patient was her primary caregiver and Dr. Riff and patient's family also feel that she may not be able to provide consistent care to her sister who is much older.  I agree that patient is probably best not to be her older sisters caretaker any longer.  Previously:  12/29/2023: (She) reports an approximately 2-year history of forgetfulness, particularly issues with short-term memory.  The patient is widowed for 9 years and lives currently alone but her older sister lives with her.  Patient is essentially a sister's caretaker as sister has a lot of medical problems from what I understand.  Patient is the youngest of 3 girls, her older sister recently passed away at the age of 46, she did not have any memory loss.  Her middle sister does not have any memory loss.  Mom had a diagnosis of Alzheimer's dementia and died in her 31s, her father died in his 70s from a heart attack.  Patient is a non-smoker, she does not drink much in the way of alcohol, only rarely, on special occasions.  She does not drink a whole lot of water, estimates that she drinks about 2-3 bottles of water per day, caffeine in the form of coffee, 1 cup or less per day and occasional soda.  She has not fallen, she has not had any sudden onset one-sided weakness or numbness  or tingling or droopy face or slurring of speech.  She has 1 son who lives in [place], her other daughter lives in the [place] area and her youngest daughter lives about an hour away.  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   I reviewed your office note from 08/22/2023.  She had blood work through your office on 08/22/2023 and I reviewed test results in her paper chart.  Lipid panel showed benign findings with total cholesterol of 179, triglycerides 120, LDL 85, CMP benign with BUN 14, creatinine 0.79, sodium 140, potassium 4.4, AST 18, ALT 11.  TSH was below normal at 0.28, folate normal at 10.4.  She also had a vitamin D and vitamin B12 check, but those 2 results are not available for my review today.  She had a brain MRI without contrast on 09/02/2023 and I reviewed the results:   IMPRESSION:   Mild cerebral volume loss with mild chronic microvascular ischemic changes.   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   Patient's daughter supplements the history and reports that patient has not been sleeping well at night because of sister's issues and patient's sister keeping her awake at night.  She denies any significant snoring, she has never had a sleep study.   Her Past Medical History Is Significant For: Past Medical History:  Diagnosis Date   Anemia    Complication of anesthesia    , 1987 difficult to wake up   Eczema    Herpes    Migraine    Seasonal allergies     Her Past Surgical History Is Significant For: Past Surgical History:  Procedure Laterality Date   CESAREAN SECTION     x 3   THYROID  LOBECTOMY Left 05/24/2022   Procedure: LEFT THYROID  LOBECTOMY;  Surgeon: Kinsinger, Herlene Righter, MD;  Location: [facility];  Service: General;  Laterality: Left;   TONSILLECTOMY      Her Family History Is Significant For: History reviewed. No pertinent family history.  Her Social History Is Significant For: Social History   Socioeconomic History   Marital status: Widowed    Spouse name: Not on file   Number of children: Not on file   Years of education: Not on file   Highest education level: Not on file  Occupational History   Not on file  Tobacco Use   Smoking status: Never   Smokeless tobacco: Not on file  Vaping Use   Vaping status: Never Used  Substance and Sexual Activity   Alcohol use: Not Currently   Drug use: Never   Sexual activity: Not on file  Other Topics Concern   Not on file  Social History Narrative   Not on file   Social Drivers of Health   Financial  Resource Strain: Not on file  Food Insecurity: No Food Insecurity (05/24/2022)   Hunger Vital Sign    Worried About Running Out of Food in the Last Year: Never true    Ran Out of Food in the Last Year: Never true  Transportation Needs: No Transportation Needs (05/24/2022)   PRAPARE - Administrator, Civil Service (Medical): No    Lack of Transportation (Non-Medical): No  Physical Activity: Not on file  Stress: Not on file  Social Connections: Not on file    Her Allergies Are:  Allergies  Allergen Reactions   Codeine Other (See Comments)    Unknown   Doxycycline Other (See Comments)    Unknown   Guaifenesin-Codeine Other (See Comments)    Cough syrup   Hydrocodone-Guaifenesin     Cough syrup   Lipitor [Atorvastatin] Other (See Comments)    Unknown   Lovaza [Omega-3-Acid Ethyl Esters (Fish)]    Naproxen Other (See Comments)    Unknown   Sulfonamide Derivatives Other (See Comments)    Unknown  :   Her Current Medications Are:  Outpatient Encounter Medications as of 03/27/2024  Medication Sig   levothyroxine  (SYNTHROID ) 75 MCG tablet Take 75 mcg by mouth every morning.    rosuvastatin (CRESTOR) 10 MG tablet Take 10 mg by mouth daily.   [DISCONTINUED] calcium  carbonate (OS-CAL - DOSED IN MG OF ELEMENTAL CALCIUM ) 1250 (500 Ca) MG tablet Take 2 tablets (2,500 mg total) by mouth daily with breakfast.   [DISCONTINUED] cephALEXin (KEFLEX) 500 MG capsule Take 500 mg by mouth 3 (three) times daily.   [DISCONTINUED] ciclopirox  (PENLAC ) 8 % solution Apply topically at bedtime. Apply over nail and surrounding skin. Apply daily over previous coat. After seven (7) days, may remove with alcohol and continue cycle.   [DISCONTINUED] levothyroxine  (SYNTHROID ) 50 MCG tablet Take 50 mcg by mouth every morning. (Patient not taking: Reported on 12/29/2023)   [DISCONTINUED] loratadine -pseudoephedrine (CLARITIN -D 24-HOUR) 10-240 MG 24 hr tablet Take 1 tablet by mouth at bedtime.   [DISCONTINUED] traMADol  (ULTRAM ) 50 MG tablet Take 1 tablet (50 mg total) by mouth every 6 (six) hours as needed (mild pain).   No facility-administered encounter medications on file as of 03/27/2024.  :  Review of Systems:  Out of a complete 14 point review of systems, all are reviewed and negative with the exception of these symptoms as listed below:  Review of Systems  Objective:  Neurological Exam  Physical Exam Physical Examination:   Vitals:   03/27/24 0756  BP: 118/66  Pulse: (!) 55    General Examination: The patient is a very pleasant 63 y.o. female in no acute distress. She appears well-developed and well-nourished and well groomed.   HEENT: Normocephalic, atraumatic, pupils are equal, round and reactive to light, extraocular tracking is well-preserved, corrective eyeglasses in place.  Hearing grossly intact.  Face is symmetric with normal facial animation, speech is clear without dysarthria, hypophonia or voice tremor.  Neck with full range of motion, no carotid bruits.   Overall stable exam.  Chest: Clear to auscultation without wheezing, rhonchi or crackles noted.   Heart: S1+S2+0,  regular and normal without murmurs, rubs or gallops noted.  She does have very mild bradycardia on auscultation.   Abdomen: Soft, non-tender and non-distended.   Extremities: There is nonpitting puffiness in the distal lower extremities bilaterally.    Skin: Warm and dry without trophic changes noted.    Musculoskeletal: exam reveals no obvious joint deformities.  Neurologically:  Mental status: The patient is awake, alert and pays good attention, she is able to provide some history and additional questions and comments as well as history are provided by her daughter-in-law.        12/29/2023   10:17 AM  Montreal Cognitive Assessment   Visuospatial/ Executive (0/5) 2  Naming (0/3) 3  Attention: Read list of digits (0/2) 2  Attention: Read list of letters (0/1) 1  Attention: Serial 7 subtraction starting at 100 (0/3) 1  Language: Repeat phrase (0/2) 2  Language : Fluency (0/1) 1  Abstraction (0/2) 2  Delayed Recall (0/5) 0  Orientation (0/6) 4  Total 18    On 12/29/2023: CDT: 2/4.   Thought process is linear. Mood is normal and affect is normal.  Cranial nerves II - XII are as described above under HEENT exam.  Motor exam: Normal bulk, moving all 4 extremities without restriction, reflexes about 1+ throughout.    Fine motor skills and coordination: grossly intact.  Cerebellar testing: No dysmetria or intention tremor. There is no truncal or gait ataxia.   Reflexes 1+ throughout. Sensory exam: intact to light touch in the upper and lower extremities.  Gait, station and balance: She stands easily. No veering to one side is noted. No leaning to one side is noted. Posture is age-appropriate and stance is narrow based. Gait shows normal stride length and normal pace. No problems turning are noted.       Assessment & Plan:  In summary, Joanne Smith is a very pleasant 63 year old female with an underlying medical history of eczema, allergies, anemia, migraine headaches,  hyperlipidemia, hypothyroidism, osteopenia, orthostatic hypotension, chest pain, brain microhemorrhage, lacunar stroke, and depression, who presents for follow-up consultation of her short-term memory issues of 2+ years duration.  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  There have been no obvious behavioral changes reported.  Today, I suggested we start her on low-dose donepezil.  I originally wanted her to start 5 mg for a month and then increase it to 10 mg daily but given that she has a lower heart rate today, I would like to be cautious.  We talked about expectations, side effects and limitations of this medication at length today.  Patient was also given written/printed resources for dementia patients in [facility].  We will plan a follow-up in about 6 months, sooner if needed.  They are advised to keep a close eye on her ability to drive and if need be she can get a formal drivers evaluation done. They are looking into long-term living situation for patient's sister as she is currently in rehab and patient was her caretaker but we mutually agreed that she should not be her older sister's primary caretaker any longer. I answered all their questions today and the patient and her daughter-in-law were in agreement.   I spent 40 minutes in total face-to-face time and in reviewing records during pre-charting, more than 50% of which was spent in counseling and coordination of care, reviewing test results, reviewing medications and treatment regimen and/or in discussing or reviewing the diagnosis of mild dementia, the prognosis and treatment options. Pertinent laboratory and imaging test results that were available during this visit with the patient were reviewed by me and considered in my medical decision making (see chart for details).

## 2024-03-27 NOTE — Patient Instructions (Addendum)
 We will start you on a medication called Aricept (generic name: donepezil) 5 mg: take one pill each evening. Common side effects may include dry eyes, dry mouth, confusion, low pulse, low blood pressure, also GI related side effects (nausea, vomiting, diarrhea, constipation), headaches; rare side effects may include hallucinations and seizures.   Since your heart rate is below 60, I would like to stay with the lower dose for now and see how you tolerate it.  I had originally planned that you start on 5 mg for a month and then increase it to 10 mg daily, I have changed this to 5 mg daily for now.   I am worried about your driving, please have your family monitor it and I would suggest at this point only local roads, familiar routes, no nighttime and no highway driving.   Please look into getting a call alert button, like [employer].   These are some numbers of places that can do a formal driving evaluation.  [facility] in [place]: [phone]  2.   [facility]: [phone]  3.   [facility]: [phone]  4.   [facility]: [phone] or [phone]

## 2024-04-15 DIAGNOSIS — D509 Iron deficiency anemia, unspecified: Secondary | ICD-10-CM | POA: Diagnosis not present

## 2024-04-15 DIAGNOSIS — F0390 Unspecified dementia without behavioral disturbance: Secondary | ICD-10-CM | POA: Diagnosis not present

## 2024-04-15 DIAGNOSIS — E782 Mixed hyperlipidemia: Secondary | ICD-10-CM | POA: Diagnosis not present

## 2024-04-15 DIAGNOSIS — Z23 Encounter for immunization: Secondary | ICD-10-CM | POA: Diagnosis not present

## 2024-04-15 DIAGNOSIS — R413 Other amnesia: Secondary | ICD-10-CM | POA: Diagnosis not present

## 2024-04-15 DIAGNOSIS — E063 Autoimmune thyroiditis: Secondary | ICD-10-CM | POA: Diagnosis not present

## 2024-05-01 NOTE — Progress Notes (Signed)
° °  NEUROPSYCHOLOGICAL EVALUATION [place]  Physical Medicine and Rehabilitation     Patient: Joanne Smith  MRN: [id number] DOB: Jun 11, 1960   Service Provider/Clinical Neuropsychologist: Evalene DOROTHA Riff, PsyD  Date of Service: 03/15/24 Start Time: 2 PM End Time: 3 PM  Location of Service:  [facility] Physical Medicine & Rehabilitation Department [place]. [facility] [address], [place]. [address], [place] [address] Phone: [phone]   Billing Code/Service: [phone]    Individuals present: Patient, family, Provider Laurier DOROTHA Riff, PsyD)  Provider conducted the 60-minute interactive feedback appointment in-person with the patient and family.  The provider reviewed and discussed the results of neuropsychological evaluation. Follow-up interviewing was conducted as needed to refine interpretation of findings as needed. Review of results included overall findings, diagnosis, and treatment planning/recommendations that were derived from integration of patient data, interpretation of standardized rest results and clinical data, and clinical decision making, which are documented in the patient's electronic medical record with the full report (date listed below). A copy of the full report will also be mailed to the patient.   The patient expressed understanding of the information reviewed. The patient was provided opportunity to ask questions which were then answered by the provider. The provider worked collaboratively to tailor treatment recommendations to the patient when possible. The patient was informed they could reach out to the provider should additional questions related to the evaluation arise.    The final neuropsychological evaluation report, documented in the patient's chart (DATE 03/04/24), was amended to reflect any additional information obtained during the feedback appointment including treatment planning collaboration.    Evalene DOROTHA Riff, PsyD [facility] [address] [place], [place] [address] Main: [phone] Fax: [phone] [place] License # [id number]  This report was generated using voice recognition software. While this document has been carefully reviewed, transcription errors may be present. I apologize in advance for any inconvenience. Please contact me if further clarification is needed.

## 2024-09-24 ENCOUNTER — Ambulatory Visit: Admitting: Neurology
